# Patient Record
Sex: Female | Born: 1980 | Race: White | Hispanic: No | Smoking: Current every day smoker
Health system: Southern US, Community
[De-identification: ages and names within clinical notes are randomized; demographics above are authoritative.]

## PROBLEM LIST (undated history)

## (undated) DIAGNOSIS — E669 Obesity, unspecified: Secondary | ICD-10-CM

## (undated) DIAGNOSIS — K589 Irritable bowel syndrome without diarrhea: Secondary | ICD-10-CM

## (undated) DIAGNOSIS — I10 Essential (primary) hypertension: Secondary | ICD-10-CM

## (undated) HISTORY — PX: CHOLECYSTECTOMY: SHX55

## (undated) HISTORY — DX: Irritable bowel syndrome without diarrhea: K58.9

## (undated) HISTORY — DX: Obesity, unspecified: E66.9

## (undated) HISTORY — DX: Essential (primary) hypertension: I10

---

## 2013-06-10 ENCOUNTER — Emergency Department (HOSPITAL_COMMUNITY): Payer: Self-pay

## 2013-06-10 ENCOUNTER — Emergency Department (HOSPITAL_COMMUNITY)
Admission: EM | Admit: 2013-06-10 | Discharge: 2013-06-11 | Disposition: A | Discharge status: Home / Self Care | Payer: Self-pay | Attending: Emergency Medicine | Admitting: Emergency Medicine

## 2013-06-10 ENCOUNTER — Encounter (HOSPITAL_COMMUNITY): Payer: Self-pay | Admitting: Emergency Medicine

## 2013-06-10 DIAGNOSIS — F17200 Nicotine dependence, unspecified, uncomplicated: Secondary | ICD-10-CM | POA: Insufficient documentation

## 2013-06-10 DIAGNOSIS — Z87828 Personal history of other (healed) physical injury and trauma: Secondary | ICD-10-CM | POA: Insufficient documentation

## 2013-06-10 DIAGNOSIS — M8430XA Stress fracture, unspecified site, initial encounter for fracture: Secondary | ICD-10-CM

## 2013-06-10 DIAGNOSIS — M84376A Stress fracture, unspecified foot, initial encounter for fracture: Secondary | ICD-10-CM | POA: Insufficient documentation

## 2013-06-10 NOTE — ED Notes (Signed)
C/o L foot pain/brusing x 3 weeks.  States she slipped and fell 3 weeks ago.

## 2013-06-10 NOTE — ED Provider Notes (Signed)
CSN: [id number]     Arrival date & time 06/10/13  2317 History  This chart was scribed for non-physician practitioner Cleatrice Burke, PA-C working with Johnna Acosta, MD by Eston Mould, ED Scribe. This patient was seen in room TR05C/TR05C and the patient's care was started at 11:44 PM    Chief Complaint  Patient presents with  . Foot Pain   The history is provided by the patient. No language interpreter was used.   HPI Comments: ESMA KILTS is a 33 y.o. female who presents to the Emergency Department complaining of ongoing worsening L foot pain and bruising that began 3 weeks ago. Pt states she slipped and fell on water 3 weeks ago and states her L foot pain has been constant. The pain is dull and sharp when she ambulates. The pain is worse with ambulation. She was seen at [facility] and was told there was nothing broken. She has taken Tramadol which has not improved her pain. She denies fevers, chills, paresthesias, numbness, weakness, nausea, vomiting.    History reviewed. No pertinent past medical history. History reviewed. No pertinent past surgical history. No family history on file. History  Substance Use Topics  . Smoking status: Current Every Day Smoker  . Smokeless tobacco: Not on file  . Alcohol Use: No   OB History   Grav Para Term Preterm Abortions TAB SAB Ect Mult Living                 Review of Systems  Constitutional: Negative for fever and chills.  Respiratory: Negative for shortness of breath.   Cardiovascular: Negative for chest pain.  Musculoskeletal: Positive for arthralgias, gait problem and myalgias.  All other systems reviewed and are negative.    Allergies  Review of patient's allergies indicates not on file.  Home Medications  No current outpatient prescriptions on file.  BP 125/84  Pulse 93  Temp(Src) 97.5 F (36.4 C) (Oral)  Resp 16  Ht 5\' 5"  (1.651 m)  Wt 314 lb 4 oz (142.543 kg)  BMI 52.29 kg/m2  SpO2 99%  LMP  05/27/2013  Physical Exam  Nursing note and vitals reviewed. Constitutional: She is oriented to person, place, and time. She appears well-developed and well-nourished. She does not appear ill. No distress.  Morbidly obese  HENT:  Head: Normocephalic and atraumatic.  Right Ear: External ear normal.  Left Ear: External ear normal.  Nose: Nose normal.  Mouth/Throat: Oropharynx is clear and moist.  Eyes: Conjunctivae are normal.  Neck: Normal range of motion.  Cardiovascular: Normal rate, regular rhythm, normal heart sounds, intact distal pulses and normal pulses.   Pulses:      Dorsalis pedis pulses are 2+ on the right side, and 2+ on the left side.       Posterior tibial pulses are 2+ on the right side, and 2+ on the left side.  Cap refill < 3 seconds in all toes.   Pulmonary/Chest: Effort normal and breath sounds normal. No stridor. No respiratory distress. She has no wheezes. She has no rales.  Abdominal: Soft. She exhibits no distension.  Musculoskeletal: Normal range of motion.       Left ankle: She exhibits swelling and ecchymosis. She exhibits no deformity, no laceration and normal pulse. Tenderness. Achilles tendon normal.       Feet:  Compartment soft, neurovascularly intact.   Neurological: She is alert and oriented to person, place, and time. She has normal strength.  Skin: Skin is warm and dry.  She is not diaphoretic. No erythema.  Psychiatric: She has a normal mood and affect. Her behavior is normal.    ED Course  Procedures  DIAGNOSTIC STUDIES: Oxygen Saturation is 99% on RA, normal by my interpretation.    COORDINATION OF CARE: 11:42 PM-Discussed treatment plan which includes foot and ankle xrays with pt at bedside and pt agreed to plan.   Labs Review Labs Reviewed - No data to display Imaging Review Dg Ankle Complete Left  06/11/2013   CLINICAL DATA:  33 year old female with pain. Initial encounter.  EXAM: LEFT ANKLE COMPLETE - 3+ VIEW  COMPARISON:  None.   FINDINGS: Large body habitus. Mortise joint alignment preserved. No joint effusion. Talar dome intact. Early degenerative spurring at the medial malleolus and calcaneus. Calcaneus appears intact. Small accessory ossicles or degenerative ossification suspected at the cuboid. No acute fracture or dislocation identified.  IMPRESSION: Large body habitus with early degenerative changes. No acute fracture or dislocation identified about the left ankle.   Electronically Signed   By: Lars Pinks M.D.   On: 06/11/2013 00:13   Dg Foot Complete Left  06/11/2013   CLINICAL DATA:  33 year old female with pain. Fall 3 weeks ago. Initial encounter. Left ankle series from the same day reported separately.  EXAM: LEFT FOOT - COMPLETE 3+ VIEW  COMPARISON:  Left ankle series from the same day reported separately.  FINDINGS: Large body habitus. Calcaneus appears intact with early degenerative spurring. Small ossific fragments lateral to the cuboid. No donor site identified. There does appear to be a small area of periosteal new bone formation at the proximal third fifth metatarsal, with underlying heterogeneous bone mineralization of the cortex. No cortical break is identified. Chronic appearing fracture of the fifth proximal phalanx. No dislocation or other acute fracture identified.  IMPRESSION: 1. Suspect healing stress fracture proximal fifth metatarsal (arrows). 2. Small ossific fragments lateral to the cuboid could be accessory ossicles, degenerative, or posttraumatic (such as due to avulsion). However, favor these are chronic. 3. Chronic/healed fracture of the fifth proximal phalanx.   Electronically Signed   By: Lars Pinks M.D.   On: 06/11/2013 00:16    EKG Interpretation   None      MDM   1. Stress fracture    Patient presents 3 weeks after a fall with left foot pain. XR shows healing stress fracture in proximal fifth metatarsal, chronic/healed fracture of the fifth proximal phalanx, as well as changes in the cuboid  suspected to be chronic. I discussed these results with the patient. She was placed in a post op shoe and given orthopedic follow up. Patient declined crutches. She will be discharged home with norco. I also discussed with the patient the benefit of weight loss for these injuries. She expressed understanding and motivation for weight loss. Return instructions given. Vital signs stable for discharge. Patient / Family / Caregiver informed of clinical course, understand medical decision-making process, and agree with plan.   I personally performed the services described in this documentation, which was scribed in my presence. The recorded information has been reviewed and is accurate.    Elwyn Lade, PA-C 06/11/13 0130

## 2013-06-11 MED ORDER — HYDROCODONE-ACETAMINOPHEN 5-325 MG PO TABS: 1.0000 | ORAL_TABLET | Freq: Four times a day (QID) | ORAL | Status: DC | PRN

## 2013-06-11 MED ORDER — HYDROCODONE-ACETAMINOPHEN 5-325 MG PO TABS
2.0000 | ORAL_TABLET | Freq: Once | ORAL | Status: AC
  Administered 2013-06-11: 2 via ORAL
  Filled 2013-06-11: qty 2

## 2013-06-11 MED ORDER — ONDANSETRON 4 MG PO TBDP
8.0000 mg | ORAL_TABLET | Freq: Once | ORAL | Status: AC
  Administered 2013-06-11: 8 mg via ORAL
  Filled 2013-06-11: qty 2

## 2013-06-11 MED ORDER — PROMETHAZINE HCL 25 MG PO TABS: 25.0000 mg | ORAL_TABLET | Freq: Four times a day (QID) | ORAL | Status: DC | PRN

## 2013-06-11 NOTE — Discharge Instructions (Signed)
Stress Fracture A break in a bone that is caused by repetitive and/or intense exercise and prolonged pressure on the bone is known as a stress fracture. Stress fractures may pass through the entire bone (complete) or partially break (incomplete). SYMPTOMS   Inflammation at the fracture site.  Bleeding (uncommon).  Bruising (uncommon).  Pain and tenderness over the fracture site.  Weakness and inability to bear weight on the injured extremity.  Paleness and deformity (sometimes). CAUSES  Stress fractures are the result of repetitive micro trauma of a bone. The bone is injured at a greater rate than it can be healed by the body. This causes the bone to become weak and susceptible to fracture. Stress fractures typically follow changes in:  Training or performance schedule.  Equipment.  Intensity of activity. Women who no longer have their periods are more prone to stress fractures.  RISK INCREASES WITH:  Previous stress fracture.  Certain sports associated with specific fractures:  Leg: running, soccer, swimming, ballet, basketball.  Foot: running, walking, marching, swimming, soccer, ballet.  Heel bone: basketball, volleyball.  Thigh: running, basketball, jumping.  Kneecap (patella): basketball, catching in baseball.  Hand: tennis, handball.  Forearm: tennis, javelin.  Arm: baseball, cricket.  Ribs: tennis, baseball, golf, rowing.  Spine: gymnastics, football, cricket, water-skiing.  Bone abnormalities (osteoporosis or bone tumors).  Metabolic disorders or hormone problems.  Nutritional deficiencies and eating disorders.  Loss of or irregular menstrual periods.  Poor strength and flexibility.  Training on hard surfaces or worn out equipment (running with shoes with more than 600 miles of wear), hard arch supports made from metal or hard plastic (orthotics). PREVENTION  Warm up and stretch properly before activity.  Maintain physical fitness:  Muscle  strength and endurance.  Flexibility.  Cardiovascular fitness.  Learn and use proper technique with training and activity.  Gradually increase activity and training.  Wear properly fitted and padded protective equipment, including proper footwear; change shoes after 300 to 500 miles of running.  Women with menstrual period irregularity can take birth control pills to regulate periods and thus hormonal levels.  Runners with flat feet should wear cushioned arch supports. PROGNOSIS  Stress fractures are typically curable if treated properly. RELATED COMPLICATIONS   Failure to heal (nonunion).  Healing in a poor position (malunion).  Recurrence of stress fracture.  Stress fracture progressing to a complete and displaced fracture.  Risks of surgery, including infection, bleeding, injury to nerves (numbness, weakness, paralysis), need for further surgery, and bone death.  Recurrence of stress fractures, not necessarily in the same bone or location (occurs in 1 in 10 patients). TREATMENT  Treatment initially involves ice and medicine to help reduce pain and inflammation, as well as rest from any activity that causes symptoms to become more severe. Your caregiver may recommend that you immobilize the injured bone and associated joint(s) to allow for healing. Bone growth stimulators may be used to increase the rate of recovery. Occasionally surgery is necessary, especially if:  The fracture is at a high risk of moving and great risk of complications (hip).  The fracture is at a high risk of not healing (Jones fracture, certain leg fractures).  The fracture becomes complete and out of alignment (displaced). Immobility of a bone for a long period can cause loss of muscle bulk, stiffness in nearby joints, and accumulation of fluid in tissues (edema). Strengthening and stretch exercises may be necessary after immobilization to regain strength and a full range of motion. MEDICATION   If  pain medication is necessary, then nonsteroidal anti-inflammatory medications, such as aspirin and ibuprofen, or other minor pain relievers, such as acetaminophen, are often recommended.  Do not take pain medication for 7 days before surgery.  Prescription pain relievers may be prescribed. Use only as directed and only as much as you need.  Ointments applied to the skin may be helpful. SEEK MEDICAL CARE IF:   Symptoms get worse or do not improve in 2 weeks despite treatment.  Any of the following occur after immobilization or surgery (report any of these signs immediately):  Swelling above or below the fracture site.  Severe, persistent pain.  Blue or gray skin below the fracture site, especially under the nails, or numbness or loss of feeling below the fracture site.  New, unexplained symptoms develop (drugs used in treatment may produce side effects). Document Released: 04/21/2005 Document Revised: 07/14/2011 Document Reviewed: 08/03/2008 [facility] Patient Information 2014 [place], [place].

## 2013-06-11 NOTE — ED Provider Notes (Signed)
Medical screening examination/treatment/procedure(s) were performed by non-physician practitioner and as supervising physician I was immediately available for consultation/collaboration.    Johnna Acosta, MD 06/11/13 [phone]

## 2013-06-11 NOTE — ED Notes (Signed)
Ortho tech paged  

## 2013-06-11 NOTE — Progress Notes (Signed)
Orthopedic Tech Progress Note Patient Details:  Lindsay Soto [birth year] [id number]  Ortho Devices Type of Ortho Device: Postop shoe/boot   Katheren Shams 06/11/2013, 12:43 AM

## 2015-06-21 ENCOUNTER — Emergency Department
Admission: EM | Admit: 2015-06-21 | Discharge: 2015-06-21 | Discharge status: Absent without leave | Payer: Self-pay | Source: Home / Self Care

## 2015-08-27 ENCOUNTER — Ambulatory Visit (INDEPENDENT_AMBULATORY_CARE_PROVIDER_SITE_OTHER): Payer: Self-pay | Admitting: Family Medicine

## 2015-08-27 ENCOUNTER — Encounter: Payer: Self-pay | Admitting: Family Medicine

## 2015-08-27 VITALS — BP 163/102 | HR 85 | Ht 65.0 in | Wt 355.0 lb

## 2015-08-27 DIAGNOSIS — M793 Panniculitis, unspecified: Secondary | ICD-10-CM | POA: Insufficient documentation

## 2015-08-27 DIAGNOSIS — I10 Essential (primary) hypertension: Secondary | ICD-10-CM | POA: Insufficient documentation

## 2015-08-27 LAB — CBC
HCT: 37.2 % (ref 35.0–45.0)
Hemoglobin: 12.2 g/dL (ref 11.7–15.5)
MCH: 31.4 pg (ref 27.0–33.0)
MCHC: 32.8 g/dL (ref 32.0–36.0)
MCV: 95.9 fL (ref 80.0–100.0)
MPV: 10.1 fL (ref 7.5–12.5)
PLATELETS: 281 10*3/uL (ref 140–400)
RBC: 3.88 MIL/uL (ref 3.80–5.10)
RDW: 13.2 % (ref 11.0–15.0)
WBC: 7.4 10*3/uL (ref 3.8–10.8)

## 2015-08-27 MED ORDER — HYDROCHLOROTHIAZIDE 25 MG PO TABS: 25.0000 mg | ORAL_TABLET | Freq: Every day | ORAL | Status: DC

## 2015-08-27 MED ORDER — CEPHALEXIN 500 MG PO CAPS: 500.0000 mg | ORAL_CAPSULE | Freq: Four times a day (QID) | ORAL | Status: DC

## 2015-08-27 MED ORDER — NYSTATIN 100000 UNIT/GM EX CREA: 1.0000 "application " | TOPICAL_CREAM | Freq: Two times a day (BID) | CUTANEOUS | Status: DC

## 2015-08-27 NOTE — Progress Notes (Signed)
       Lindsay Soto is a 35 y.o. female who presents to [facility]: [facility] today for establish care and discuss redness on abdominal wall. Patient has morbid obesity and notes a previous history of elevated blood pressure. It's been many years since she has been to doctor. She also notes a history of IBS and laparoscopic cholecystectomy. She notes frequent bowel movements. She notes the abdominal wall redness started yesterday and is not painful. She feels well otherwise with no fevers or chills.   Past Medical History  Diagnosis Date  . Hypertension   . Obese    Past Surgical History  Procedure Laterality Date  . Cholecystectomy     Social History  Substance Use Topics  . Smoking status: Current Every Day Smoker  . Smokeless tobacco: Not on file  . Alcohol Use: No   family history includes Depression in her mother; Hyperlipidemia in her mother; Hypertension in her mother.  ROS as above Medications: Current Outpatient Prescriptions  Medication Sig Dispense Refill  . cephALEXin (KEFLEX) 500 MG capsule Take 1 capsule (500 mg total) by mouth 4 (four) times daily. 28 capsule 0  . hydrochlorothiazide (HYDRODIURIL) 25 MG tablet Take 1 tablet (25 mg total) by mouth daily. 30 tablet 0  . nystatin cream (MYCOSTATIN) Apply 1 application topically 2 (two) times daily. 60 g 1   No current facility-administered medications for this visit.   No Known Allergies   Exam:  BP 163/102 mmHg  Pulse 85  Ht 5\' 5"  (1.651 m)  Wt 355 lb (161.027 kg)  BMI 59.08 kg/m2 Gen: Well NAD Morbid obesity HEENT: EOMI,  MMM Lungs: Normal work of breathing. CTABL Heart: RRR no MRG Abd: NABS, Soft. Nondistended, Nontender Exts: Brisk capillary refill, warm and well perfused.  Skin: Abdominal wall redness with erythema no significant induration or ulcerations. Nontender. Blanchable. Left inferior corner is  involved  No results found for this or any previous visit (from the past 24 hour(s)). No results found.   Please see individual assessment and plan sections.

## 2015-08-27 NOTE — Assessment & Plan Note (Signed)
Check metabolic panel and start hydrochlorothiazide. Recheck in one month.

## 2015-08-27 NOTE — Assessment & Plan Note (Signed)
Panniculitis. Unclear etiology. Suspect fungal. Treat with nystatin ointment and Keflex.

## 2015-08-27 NOTE — Assessment & Plan Note (Signed)
Eliminate sugar sweetened beverages. Check CMP and A1c

## 2015-08-27 NOTE — Patient Instructions (Addendum)
Thank you for coming in today. Get labs,  Take antiotics and use fungal cream.  Start the blood pressure medicine.  Return in 1 month or sooner.   Please see Bonna Gains to qualify for reduced or free medical services within the [place].  Call her at [phone] today.  Please call or see Ms Cheryle Horsfall for assistance with your bill.  You may qualify for reduced or free services.  Her phone number is [phone]. Her email is [email or web address]   Hydrochlorothiazide, HCTZ capsules or tablets What is this medicine? HYDROCHLOROTHIAZIDE (hye droe klor oh THYE a zide) is a diuretic. It increases the amount of urine passed, which causes the body to lose salt and water. This medicine is used to treat high blood pressure. It is also reduces the swelling and water retention caused by various medical conditions, such as heart, liver, or kidney disease. This medicine may be used for other purposes; ask your health care provider or pharmacist if you have questions. What should I tell my health care provider before I take this medicine? They need to know if you have any of these conditions: -diabetes -gout -immune system problems, like lupus -kidney disease or kidney stones -liver disease -pancreatitis -small amount of urine or difficulty passing urine -an unusual or allergic reaction to hydrochlorothiazide, sulfa drugs, other medicines, foods, dyes, or preservatives -pregnant or trying to get pregnant -breast-feeding How should I use this medicine? Take this medicine by mouth with a glass of water. Follow the directions on the prescription label. Take your medicine at regular intervals. Remember that you will need to pass urine frequently after taking this medicine. Do not take your doses at a time of day that will cause you problems. Do not stop taking your medicine unless your doctor tells you to. Talk to your pediatrician regarding the use of this medicine in  children. Special care may be needed. Overdosage: If you think you have taken too much of this medicine contact a poison control center or emergency room at once. NOTE: This medicine is only for you. Do not share this medicine with others. What if I miss a dose? If you miss a dose, take it as soon as you can. If it is almost time for your next dose, take only that dose. Do not take double or extra doses. What may interact with this medicine? -cholestyramine -colestipol -digoxin -dofetilide -lithium -medicines for blood pressure -medicines for diabetes -medicines that relax muscles for surgery -other diuretics -steroid medicines like prednisone or cortisone This list may not describe all possible interactions. Give your health care provider a list of all the medicines, herbs, non-prescription drugs, or dietary supplements you use. Also tell them if you smoke, drink alcohol, or use illegal drugs. Some items may interact with your medicine. What should I watch for while using this medicine? Visit your doctor or health care professional for regular checks on your progress. Check your blood pressure as directed. Ask your doctor or health care professional what your blood pressure should be and when you should contact him or her. You may need to be on a special diet while taking this medicine. Ask your doctor. Check with your doctor or health care professional if you get an attack of severe diarrhea, nausea and vomiting, or if you sweat a lot. The loss of too much body fluid can make it dangerous for you to take this medicine. You may get drowsy or dizzy. Do not drive, use machinery, or do  anything that needs mental alertness until you know how this medicine affects you. Do not stand or sit up quickly, especially if you are an older patient. This reduces the risk of dizzy or fainting spells. Alcohol may interfere with the effect of this medicine. Avoid alcoholic drinks. This medicine may affect your  blood sugar level. If you have diabetes, check with your doctor or health care professional before changing the dose of your diabetic medicine. This medicine can make you more sensitive to the sun. Keep out of the sun. If you cannot avoid being in the sun, wear protective clothing and use sunscreen. Do not use sun lamps or tanning beds/booths. What side effects may I notice from receiving this medicine? Side effects that you should report to your doctor or health care professional as soon as possible: -allergic reactions such as skin rash or itching, hives, swelling of the lips, mouth, tongue, or throat -changes in vision -chest pain -eye pain -fast or irregular heartbeat -feeling faint or lightheaded, falls -gout attack -muscle pain or cramps -pain or difficulty when passing urine -pain, tingling, numbness in the hands or feet -redness, blistering, peeling or loosening of the skin, including inside the mouth -unusually weak or tired Side effects that usually do not require medical attention (report to your doctor or health care professional if they continue or are bothersome): -change in sex drive or performance -dry mouth -headache -stomach upset This list may not describe all possible side effects. Call your doctor for medical advice about side effects. You may report side effects to FDA at 1-800-FDA-1088. Where should I keep my medicine? Keep out of the reach of children. Store at room temperature between 15 and 30 degrees C (59 and 86 degrees F). Do not freeze. Protect from light and moisture. Keep container closed tightly. Throw away any unused medicine after the expiration date. NOTE: This sheet is a summary. It may not cover all possible information. If you have questions about this medicine, talk to your doctor, pharmacist, or health care provider.    2016, Elsevier/Gold Standard. (2009-12-14 12:57:37)

## 2015-08-28 ENCOUNTER — Encounter: Payer: Self-pay | Admitting: Family Medicine

## 2015-08-28 DIAGNOSIS — R7303 Prediabetes: Secondary | ICD-10-CM | POA: Insufficient documentation

## 2015-08-28 LAB — COMPREHENSIVE METABOLIC PANEL
ALT: 14 U/L (ref 6–29)
AST: 13 U/L (ref 10–30)
Albumin: 3.7 g/dL (ref 3.6–5.1)
Alkaline Phosphatase: 76 U/L (ref 33–115)
BUN: 10 mg/dL (ref 7–25)
CHLORIDE: 103 mmol/L (ref 98–110)
CO2: 27 mmol/L (ref 20–31)
CREATININE: 0.84 mg/dL (ref 0.50–1.10)
Calcium: 8.9 mg/dL (ref 8.6–10.2)
GLUCOSE: 92 mg/dL (ref 65–99)
POTASSIUM: 4 mmol/L (ref 3.5–5.3)
SODIUM: 138 mmol/L (ref 135–146)
Total Bilirubin: 0.2 mg/dL (ref 0.2–1.2)
Total Protein: 6.7 g/dL (ref 6.1–8.1)

## 2015-08-28 LAB — HEMOGLOBIN A1C
Hgb A1c MFr Bld: 5.9 % — ABNORMAL HIGH (ref <5.7)
Mean Plasma Glucose: 123 mg/dL

## 2015-08-28 NOTE — Progress Notes (Signed)
Quick Note:  Lindsay Soto has prediabetes. Work on reducing sugar in the diet especially in drinks. Try diet soda instead. Other labs looked OK. ______

## 2015-09-17 ENCOUNTER — Ambulatory Visit (INDEPENDENT_AMBULATORY_CARE_PROVIDER_SITE_OTHER): Payer: Self-pay | Admitting: Family Medicine

## 2015-09-17 VITALS — BP 146/94 | HR 89 | Wt 339.0 lb

## 2015-09-17 DIAGNOSIS — I10 Essential (primary) hypertension: Secondary | ICD-10-CM

## 2015-09-17 DIAGNOSIS — G4700 Insomnia, unspecified: Secondary | ICD-10-CM

## 2015-09-17 DIAGNOSIS — F329 Major depressive disorder, single episode, unspecified: Secondary | ICD-10-CM | POA: Insufficient documentation

## 2015-09-17 DIAGNOSIS — M793 Panniculitis, unspecified: Secondary | ICD-10-CM

## 2015-09-17 DIAGNOSIS — F332 Major depressive disorder, recurrent severe without psychotic features: Secondary | ICD-10-CM

## 2015-09-17 DIAGNOSIS — G4733 Obstructive sleep apnea (adult) (pediatric): Secondary | ICD-10-CM

## 2015-09-17 MED ORDER — LISINOPRIL-HYDROCHLOROTHIAZIDE 20-25 MG PO TABS: 1.0000 | ORAL_TABLET | Freq: Every day | ORAL | Status: DC

## 2015-09-17 MED ORDER — CITALOPRAM HYDROBROMIDE 10 MG PO TABS: 10.0000 mg | ORAL_TABLET | Freq: Every day | ORAL | Status: DC

## 2015-09-17 MED ORDER — TRAZODONE HCL 50 MG PO TABS: 25.0000 mg | ORAL_TABLET | Freq: Every evening | ORAL | Status: DC | PRN

## 2015-09-17 NOTE — Assessment & Plan Note (Signed)
Not well controlled. Increase to lisinopril/hydrochlorothiazide. Recheck in one month.

## 2015-09-17 NOTE — Assessment & Plan Note (Signed)
Improved will continue to follow.

## 2015-09-17 NOTE — Assessment & Plan Note (Signed)
Patient lost 20 pounds. Continue to encourage weight loss with diet modification.

## 2015-09-17 NOTE — Patient Instructions (Signed)
Thank you for coming in today. Take the new blood pressure medicine.  Take the celexa for depression and trazodone for sleep.  Return in 1 month.

## 2015-09-17 NOTE — Assessment & Plan Note (Signed)
Major depression severe. Start Celexa and trazodone. Recheck in one month.

## 2015-09-17 NOTE — Assessment & Plan Note (Signed)
Due to depression possibly also due to sleep apnea. Trial of trazodone. Recheck in one month.

## 2015-09-17 NOTE — Progress Notes (Signed)
       Lindsay Soto is a 35 y.o. female who presents to [facility]: [facility] today for   1) follow-up panniculitis: Patient was seen a month ago diagnosed with panniculitis and treated with Keflex and topical antifungal cream. This has helped a lot and she feels essentially 100% better.  2) follow-up hypertension: Patient was started on hydrochlorothiazide one month ago. She tolerates the medicine well with no chest pain palpitations or shortness of breath.  3) depression: Patient notes a history of major depression. Spent some time since she was ever on any medications for depression. She notes feeling down and depressed and hopeless and difficulty falling asleep. She feels better about herself. She denies any active SI or HI.  4) insomnia as noted above. Patient has had a long-term difficulty falling asleep and staying asleep. Has a diagnosis of sleep apnea and uses a CPAP machine.   5) disability. Patient about 10 years ago was declared permanently disabled. At some point somehow she lost this disability accreditation and is having to reapply. She notes that she has not worked in quite some time. She is unable to work due to her IBS-D, difficulty walking and standing, and depression and obesity.    Past Medical History  Diagnosis Date  . Hypertension   . Obese    Past Surgical History  Procedure Laterality Date  . Cholecystectomy     Social History  Substance Use Topics  . Smoking status: Current Every Day Smoker  . Smokeless tobacco: Not on file  . Alcohol Use: No   family history includes Depression in her mother; Hyperlipidemia in her mother; Hypertension in her mother.  ROS as above Medications: Current Outpatient Prescriptions  Medication Sig Dispense Refill  . citalopram (CELEXA) 10 MG tablet Take 1 tablet (10 mg total) by mouth daily. 30 tablet 0  . lisinopril-hydrochlorothiazide  (PRINZIDE,ZESTORETIC) 20-25 MG tablet Take 1 tablet by mouth daily. 30 tablet 0  . traZODone (DESYREL) 50 MG tablet Take 0.5-1 tablets (25-50 mg total) by mouth at bedtime as needed for sleep. 30 tablet 3   No current facility-administered medications for this visit.   No Known Allergies   Exam:  BP 146/94 mmHg  Pulse 89  Wt 339 lb (153.769 kg) Gen: Well NAD Morbidly obese HEENT: EOMI,  MMM Lungs: Normal work of breathing. CTABL Heart: RRR no MRG Abd: NABS, Soft. Nondistended, Nontender Exts: Brisk capillary refill, warm and well perfused.  Marland Kitchen Psych: Alert and oriented normal speech thought process and affect no SI or HI expressed.  PHQ9 is 20.  No results found for this or any previous visit (from the past 24 hour(s)). No results found.   Please see individual assessment and plan sections.

## 2015-09-26 ENCOUNTER — Ambulatory Visit (INDEPENDENT_AMBULATORY_CARE_PROVIDER_SITE_OTHER): Payer: Self-pay | Admitting: Family Medicine

## 2015-09-26 ENCOUNTER — Ambulatory Visit: Payer: Self-pay | Admitting: Family Medicine

## 2015-09-26 ENCOUNTER — Encounter: Payer: Self-pay | Admitting: Family Medicine

## 2015-09-26 VITALS — BP 139/90 | HR 83 | Wt 340.0 lb

## 2015-09-26 DIAGNOSIS — G4700 Insomnia, unspecified: Secondary | ICD-10-CM

## 2015-09-26 DIAGNOSIS — I10 Essential (primary) hypertension: Secondary | ICD-10-CM

## 2015-09-26 DIAGNOSIS — F332 Major depressive disorder, recurrent severe without psychotic features: Secondary | ICD-10-CM

## 2015-09-26 MED ORDER — CITALOPRAM HYDROBROMIDE 20 MG PO TABS: 20.0000 mg | ORAL_TABLET | Freq: Every day | ORAL | Status: DC

## 2015-09-26 MED ORDER — LISINOPRIL-HYDROCHLOROTHIAZIDE 20-25 MG PO TABS: 1.0000 | ORAL_TABLET | Freq: Every day | ORAL | Status: DC

## 2015-09-26 NOTE — Patient Instructions (Signed)
Thank you for coming in today. Increase citalopram to 20mg  daily.  Continue the other medicine.  Get myfitnesspal.  Return in 1 month.  Call or go to the emergency room if you get worse, have trouble breathing, have chest pains, or palpitations.

## 2015-09-26 NOTE — Progress Notes (Signed)
       Lindsay Soto is a 35 y.o. female who presents to [facility] today for   1) hypertension: Patient tolerates the medicine well. No chest pains palpitations or shortness of breath. She feels great.  2) depression and anxiety. Patient tolerates citalopram. She feels well and notes improvement in mood symptoms.  3) insomnia: Patient notes improvement in insomnia trazodone.   Past Medical History  Diagnosis Date  . Hypertension   . Obese    Past Surgical History  Procedure Laterality Date  . Cholecystectomy     Social History  Substance Use Topics  . Smoking status: Current Every Day Smoker  . Smokeless tobacco: Not on file  . Alcohol Use: No   family history includes Depression in her mother; Hyperlipidemia in her mother; Hypertension in her mother.  ROS as above:  Medications: Current Outpatient Prescriptions  Medication Sig Dispense Refill  . citalopram (CELEXA) 20 MG tablet Take 1 tablet (20 mg total) by mouth daily. 30 tablet 1  . lisinopril-hydrochlorothiazide (PRINZIDE,ZESTORETIC) 20-25 MG tablet Take 1 tablet by mouth daily. 30 tablet 0  . traZODone (DESYREL) 50 MG tablet Take 0.5-1 tablets (25-50 mg total) by mouth at bedtime as needed for sleep. 30 tablet 3   No current facility-administered medications for this visit.   No Known Allergies   Exam:  BP 139/90 mmHg  Pulse 83  Wt 340 lb (154.223 kg) Gen: Well NAD HEENT: EOMI,  MMM Lungs: Normal work of breathing. CTABL Heart: RRR no MRG Abd: NABS, Soft. Nondistended, Nontender Exts: Brisk capillary refill, warm and well perfused.   No results found for this or any previous visit (from the past 24 hour(s)). No results found.    Assessment and Plan: 35 y.o. female with   1) hypertension: Stable. Check metabolic panel. Recheck 1 month  2) depression and anxiety: Doing well. Increase  Celexa. Recheck in one month  3) insomnia: Stable continue trazodone  Discussed warning signs or symptoms. Please see discharge instructions. Patient expresses understanding.

## 2015-09-27 LAB — BASIC METABOLIC PANEL
BUN: 10 mg/dL (ref 7–25)
CO2: 25 mmol/L (ref 20–31)
Calcium: 9.4 mg/dL (ref 8.6–10.2)
Chloride: 96 mmol/L — ABNORMAL LOW (ref 98–110)
Creat: 0.66 mg/dL (ref 0.50–1.10)
GLUCOSE: 107 mg/dL — AB (ref 65–99)
POTASSIUM: 3.9 mmol/L (ref 3.5–5.3)
Sodium: 136 mmol/L (ref 135–146)

## 2015-09-27 NOTE — Progress Notes (Signed)
Quick Note:  Labs look great ______ 

## 2015-10-12 ENCOUNTER — Encounter: Payer: Self-pay | Admitting: Family Medicine

## 2015-10-12 ENCOUNTER — Ambulatory Visit (INDEPENDENT_AMBULATORY_CARE_PROVIDER_SITE_OTHER): Payer: Self-pay | Admitting: Family Medicine

## 2015-10-12 VITALS — BP 147/84 | HR 73 | Wt 338.0 lb

## 2015-10-12 DIAGNOSIS — N926 Irregular menstruation, unspecified: Secondary | ICD-10-CM

## 2015-10-12 DIAGNOSIS — I10 Essential (primary) hypertension: Secondary | ICD-10-CM

## 2015-10-12 MED ORDER — MEDROXYPROGESTERONE ACETATE 10 MG PO TABS: 10.0000 mg | ORAL_TABLET | Freq: Every day | ORAL | Status: DC

## 2015-10-12 MED ORDER — AMLODIPINE BESYLATE 10 MG PO TABS: 10.0000 mg | ORAL_TABLET | Freq: Every day | ORAL | Status: DC

## 2015-10-12 NOTE — Progress Notes (Signed)
       Lindsay Soto is a 35 y.o. female who presents to [facility] today for irregular bleeding. Patient has a one-month history of spotting and irregular bleeding. She's had some occasional irregular bleeding in the past. She's had a negative home pregnancy test. She feels well otherwise with no abdominal pain fevers chills nausea vomiting or diarrhea. She currently is trying to get Medicaid but does not have health insurance this time and is concerned about running up her bill.    Hypertension: Doing well with current medications. No chest pains palpitations shortness of breath.  Past Medical History  Diagnosis Date  . Hypertension   . Obese    Past Surgical History  Procedure Laterality Date  . Cholecystectomy     Social History  Substance Use Topics  . Smoking status: Current Every Day Smoker  . Smokeless tobacco: Not on file  . Alcohol Use: No   family history includes Depression in her mother; Hyperlipidemia in her mother; Hypertension in her mother.  ROS as above:  Medications: Current Outpatient Prescriptions  Medication Sig Dispense Refill  . amLODipine (NORVASC) 10 MG tablet Take 1 tablet (10 mg total) by mouth daily. 90 tablet 1  . citalopram (CELEXA) 20 MG tablet Take 1 tablet (20 mg total) by mouth daily. 30 tablet 1  . lisinopril-hydrochlorothiazide (PRINZIDE,ZESTORETIC) 20-25 MG tablet Take 1 tablet by mouth daily. 30 tablet 0  . medroxyPROGESTERone (PROVERA) 10 MG tablet Take 1 tablet (10 mg total) by mouth daily. 15 tablet 6  . traZODone (DESYREL) 50 MG tablet Take 0.5-1 tablets (25-50 mg total) by mouth at bedtime as needed for sleep. 30 tablet 3   No current facility-administered medications for this visit.   No Known Allergies   Exam:  BP 147/84 mmHg  Pulse 73  Wt 338 lb (153.316 kg) Gen: Well NAD Morbidly obese HEENT: EOMI,  MMM Lungs:  Normal work of breathing. CTABL Heart: RRR no MRG Abd: NABS, Soft. Nondistended, Nontender Exts: Brisk capillary refill, warm and well perfused.  GYN exam declined  No results found for this or any previous visit (from the past 24 hour(s)). No results found.    Assessment and Plan: 35 y.o. female with   1) irregular bleeding: Concern for PCOS or anovulatory cycles. Patient would like to avoid a more thorough workup. She's unable to provide urine for urine pregnancy test after one hour of waiting. Plan for return Monday for nurse visit for pregnancy test. Will treat with Provera intermittently. She obtains health insurance will complete a further workup with pelvic ultrasound with possible endometrial biopsy.  2) hypertension: Not well controlled. Continue lisinopril hydrochlorothiazide. Add amlodipine. Recheck in about a month.  Discussed warning signs or symptoms. Please see discharge instructions. Patient expresses understanding.

## 2015-10-12 NOTE — Patient Instructions (Addendum)
Thank you for coming in today. Take provera 5 days after your period. Take it for 15 days then stop. You should have a period and then return to normal. Your blood pressure is still up.  Start taking amlodipine daily for blood pressure control as well.  Return Monday for a nurse visit for a urine pregnancy test.  Do not take these medicines until you have a negative test.   Polycystic Ovarian Syndrome Polycystic ovarian syndrome (PCOS) is a common hormonal disorder among women of reproductive age. Most women with PCOS grow many small cysts on their ovaries. PCOS can cause problems with your periods and make it difficult to get pregnant. It can also cause an increased risk of miscarriage with pregnancy. If left untreated, PCOS can lead to serious health problems, such as diabetes and heart disease. CAUSES The cause of PCOS is not fully understood, but genetics may be a factor. SIGNS AND SYMPTOMS   Infrequent or no menstrual periods.   Inability to get pregnant (infertility) because of not ovulating.   Increased growth of hair on the face, chest, stomach, back, thumbs, thighs, or toes.   Acne, oily skin, or dandruff.   Pelvic pain.   Weight gain or obesity, usually carrying extra weight around the waist.   Type 2 diabetes.   High cholesterol.   High blood pressure.   Female-pattern baldness or thinning hair.   Patches of thickened and dark brown or black skin on the neck, arms, breasts, or thighs.   Tiny excess flaps of skin (skin tags) in the armpits or neck area.   Excessive snoring and having breathing stop at times while asleep (sleep apnea).   Deepening of the voice.   Gestational diabetes when pregnant.  DIAGNOSIS  There is no single test to diagnose PCOS.   Your health care provider will:   Take a medical history.   Perform a pelvic exam.   Have ultrasonography done.   Check your female and female hormone levels.   Measure glucose or  sugar levels in the blood.   Do other blood tests.   If you are producing too many female hormones, your health care provider will make sure it is from PCOS. At the physical exam, your health care provider will want to evaluate the areas of increased hair growth. Try to allow natural hair growth for a few days before the visit.   During a pelvic exam, the ovaries may be enlarged or swollen because of the increased number of small cysts. This can be seen more easily by using vaginal ultrasonography or screening to examine the ovaries and lining of the uterus (endometrium) for cysts. The uterine lining may become thicker if you have not been having a regular period.  TREATMENT  Because there is no cure for PCOS, it needs to be managed to prevent problems. Treatments are based on your symptoms. Treatment is also based on whether you want to have a baby or whether you need contraception.  Treatment may include:   Progesterone hormone to start a menstrual period.   Birth control pills to make you have regular menstrual periods.   Medicines to make you ovulate, if you want to get pregnant.   Medicines to control your insulin.   Medicine to control your blood pressure.   Medicine and diet to control your high cholesterol and triglycerides in your blood.  Medicine to reduce excessive hair growth.  Surgery, making small holes in the ovary, to decrease the amount of  female hormone production. This is done through a long, lighted tube (laparoscope) placed into the pelvis through a tiny incision in the lower abdomen.  HOME CARE INSTRUCTIONS  Only take over-the-counter or prescription medicine as directed by your health care provider.  Pay attention to the foods you eat and your activity levels. This can help reduce the effects of PCOS.  Keep your weight under control.  Eat foods that are low in carbohydrate and high in fiber.  Exercise regularly. SEEK MEDICAL CARE IF:  Your symptoms  do not get better with medicine.  You have new symptoms.   This information is not intended to replace advice given to you by your health care provider. Make sure you discuss any questions you have with your health care provider.   Document Released: 08/15/2004 Document Revised: 02/09/2013 Document Reviewed: 10/07/2012 Elsevier Interactive Patient Education [employer].

## 2015-10-15 ENCOUNTER — Ambulatory Visit: Payer: Self-pay

## 2015-10-18 ENCOUNTER — Ambulatory Visit: Payer: Self-pay | Admitting: Family Medicine

## 2015-10-24 ENCOUNTER — Ambulatory Visit: Payer: Self-pay | Admitting: Family Medicine

## 2015-10-29 ENCOUNTER — Ambulatory Visit (INDEPENDENT_AMBULATORY_CARE_PROVIDER_SITE_OTHER): Payer: Self-pay | Admitting: Family Medicine

## 2015-10-29 ENCOUNTER — Encounter: Payer: Self-pay | Admitting: Family Medicine

## 2015-10-29 VITALS — BP 124/76 | HR 76 | Wt 341.0 lb

## 2015-10-29 DIAGNOSIS — N926 Irregular menstruation, unspecified: Secondary | ICD-10-CM

## 2015-10-29 DIAGNOSIS — F332 Major depressive disorder, recurrent severe without psychotic features: Secondary | ICD-10-CM

## 2015-10-29 DIAGNOSIS — I10 Essential (primary) hypertension: Secondary | ICD-10-CM

## 2015-10-29 MED ORDER — LISINOPRIL-HYDROCHLOROTHIAZIDE 20-25 MG PO TABS: 1.0000 | ORAL_TABLET | Freq: Every day | ORAL | Status: DC

## 2015-10-29 NOTE — Patient Instructions (Signed)
Thank you for coming in today. Return in 2 months.

## 2015-10-29 NOTE — Progress Notes (Signed)
       Elisa C Tilden Dome is a 35 y.o. female who presents to [facility] today for hypertension, depression, obesity, irregular menstruation.  1) hypertension: She feels well no chest pains palpitations shortness of breath. She tolerates the medications well with no side effects.  2) depression: Feeling much better. No SI or HI expressed. She tolerates Celexa well.  3) irregular bleeding: Much better controlled with Provera  Past Medical History  Diagnosis Date  . Hypertension   . Obese    Past Surgical History  Procedure Laterality Date  . Cholecystectomy     Social History  Substance Use Topics  . Smoking status: Current Every Day Smoker  . Smokeless tobacco: Not on file  . Alcohol Use: No   family history includes Depression in her mother; Hyperlipidemia in her mother; Hypertension in her mother.  ROS as above:  Medications: Current Outpatient Prescriptions  Medication Sig Dispense Refill  . amLODipine (NORVASC) 10 MG tablet Take 1 tablet (10 mg total) by mouth daily. 90 tablet 1  . citalopram (CELEXA) 20 MG tablet Take 1 tablet (20 mg total) by mouth daily. 30 tablet 1  . lisinopril-hydrochlorothiazide (PRINZIDE,ZESTORETIC) 20-25 MG tablet Take 1 tablet by mouth daily. 90 tablet 1  . medroxyPROGESTERone (PROVERA) 10 MG tablet Take 1 tablet (10 mg total) by mouth daily. 15 tablet 6  . traZODone (DESYREL) 50 MG tablet Take 0.5-1 tablets (25-50 mg total) by mouth at bedtime as needed for sleep. 30 tablet 3   No current facility-administered medications for this visit.   No Known Allergies   Exam:  BP 124/76 mmHg  Pulse 76  Wt 341 lb (154.677 kg)  LMP 10/16/2015 (Exact Date) Gen: Well NAD Morbidly obese HEENT: EOMI,  MMM Lungs: Normal work of breathing. CTABL Heart: RRR no MRG Abd: NABS, Soft. Nondistended, Nontender Exts: Brisk capillary refill, warm and  well perfused.  Psych: Alert and oriented normal speech thought process. No SI or HI expressed. Normal affect.  No results found for this or any previous visit (from the past 24 hour(s)). No results found.    Assessment and Plan: 35 y.o. female with  1) hypertension: Doing well continue current treatment 2) depression: Doing well continue Celexa 3) irregular bleeding: Doing well continue Provera 4) weight: Persistent issue patient has difficulty maintaining weight loss. Hopefully she'll get insurance I will be in refer for bariatric surgery which she should do well with.  Discussed warning signs or symptoms. Please see discharge instructions. Patient expresses understanding.

## 2015-11-01 ENCOUNTER — Encounter: Payer: Self-pay | Admitting: Family Medicine

## 2015-11-01 NOTE — Progress Notes (Signed)
Comprehensive clinical psychological evaluation for DDS by Lysle Dingwall PhD performed on 10/23/2015 received and scanned into documentation.

## 2015-11-13 ENCOUNTER — Encounter: Payer: Self-pay | Admitting: Family Medicine

## 2015-11-19 ENCOUNTER — Ambulatory Visit (INDEPENDENT_AMBULATORY_CARE_PROVIDER_SITE_OTHER): Payer: Self-pay | Admitting: Family Medicine

## 2015-11-19 ENCOUNTER — Encounter: Payer: Self-pay | Admitting: Family Medicine

## 2015-11-19 VITALS — BP 136/80 | HR 74 | Wt 346.0 lb

## 2015-11-19 DIAGNOSIS — H9201 Otalgia, right ear: Secondary | ICD-10-CM

## 2015-11-19 NOTE — Progress Notes (Signed)
       Lindsay Soto is a 35 y.o. female who presents to [facility]: [facility] today for right-sided ear pain. Patient notes a one-month history of right ear pain and pressure and decreased hearing. She has not tried any medications yet. No fevers chills nausea vomiting or diarrhea.   Past Medical History  Diagnosis Date  . Hypertension   . Obese    Past Surgical History  Procedure Laterality Date  . Cholecystectomy     Social History  Substance Use Topics  . Smoking status: Current Every Day Smoker  . Smokeless tobacco: Not on file  . Alcohol Use: No   family history includes Depression in her mother; Hyperlipidemia in her mother; Hypertension in her mother.  ROS as above:  Medications: Current Outpatient Prescriptions  Medication Sig Dispense Refill  . amLODipine (NORVASC) 10 MG tablet Take 1 tablet (10 mg total) by mouth daily. 90 tablet 1  . citalopram (CELEXA) 20 MG tablet Take 1 tablet (20 mg total) by mouth daily. 30 tablet 1  . lisinopril-hydrochlorothiazide (PRINZIDE,ZESTORETIC) 20-25 MG tablet Take 1 tablet by mouth daily. 90 tablet 1  . medroxyPROGESTERone (PROVERA) 10 MG tablet Take 1 tablet (10 mg total) by mouth daily. 15 tablet 6  . traZODone (DESYREL) 50 MG tablet Take 0.5-1 tablets (25-50 mg total) by mouth at bedtime as needed for sleep. 30 tablet 3   No current facility-administered medications for this visit.   No Known Allergies   Exam:  BP 136/80 mmHg  Pulse 74  Wt 346 lb (156.945 kg)  LMP 10/16/2015 (Exact Date) Gen: Well NAD HEENT: EOMI,  MMM Right tympanic membrane is nonerythematous however there is a slight fluid behind the eardrums with a serous effusion. The right membrane is non-retracted. The left tympanic membrane is normal. Normal posterior pharynx. Clear nasal discharge.  Lungs: Normal work of breathing. CTABL Heart: RRR no MRG Abd:  NABS, Soft. Nondistended, Nontender Exts: Brisk capillary refill, warm and well perfused.   No results found for this or any previous visit (from the past 24 hour(s)). No results found.    Assessment and Plan: 35 y.o. female with serous otitis effusion. Treatment with Zyrtec. Follow-up as needed.  Discussed warning signs or symptoms. Please see discharge instructions. Patient expresses understanding.

## 2015-11-19 NOTE — Patient Instructions (Signed)
Thank you for coming in today. Take over the counter cetirizine also known as Zyrtec daily. You can get this cheaply over-the-counter at the [facility] outpatient pharmacy.

## 2015-12-04 ENCOUNTER — Ambulatory Visit: Payer: Self-pay | Admitting: Family Medicine

## 2016-01-28 ENCOUNTER — Encounter: Payer: Self-pay | Admitting: Family Medicine

## 2016-01-28 ENCOUNTER — Ambulatory Visit (INDEPENDENT_AMBULATORY_CARE_PROVIDER_SITE_OTHER): Payer: Self-pay | Admitting: Family Medicine

## 2016-01-28 VITALS — BP 149/79 | HR 79 | Temp 97.8°F | Wt 346.0 lb

## 2016-01-28 DIAGNOSIS — R05 Cough: Secondary | ICD-10-CM

## 2016-01-28 DIAGNOSIS — R059 Cough, unspecified: Secondary | ICD-10-CM

## 2016-01-28 MED ORDER — OMEPRAZOLE 40 MG PO CPDR: 40.0000 mg | DELAYED_RELEASE_CAPSULE | Freq: Every day | ORAL | 3 refills | Status: DC

## 2016-01-28 MED ORDER — PREDNISONE 10 MG PO TABS: 30.0000 mg | ORAL_TABLET | Freq: Every day | ORAL | 0 refills | Status: DC

## 2016-01-28 NOTE — Progress Notes (Signed)
       Lindsay Soto is a 35 y.o. female who presents to [facility]: [place] today for cough.  Patient endorses a 3 week history of a productive cough with thick, clear phlegm.  She claims the cough is worse at night.  She denies preceding URI.  No fever, chills, headache, sore throat, sneezing, or nasal congestion.  Patient also reports hoarseness and occasional wheezing when she coughs.  Denies shortness of breath and chest pain.  She notes several episodes of post-tussive emesis.  Denies heart burn and sour taste in her mouth after eating.  Patient has tried multiple OTC and herbal remedies without relief.  Of note, patient was started on lisinopril 2 months ago.  Additionally, she currently smokes 1/2 pack of cigarettes daily.     Past Medical History:  Diagnosis Date  . Hypertension   . Obese    Past Surgical History:  Procedure Laterality Date  . CHOLECYSTECTOMY     Social History  Substance Use Topics  . Smoking status: Current Every Day Smoker  . Smokeless tobacco: Not on file  . Alcohol use No   family history includes Depression in her mother; Hyperlipidemia in her mother; Hypertension in her mother.  ROS as above: No headache, visual changes, nausea, diarrhea, constipation, dizziness, skin rash, fevers, chills, night sweats, weight loss, swollen lymph nodes, body aches, joint swelling, muscle aches, chest pain, shortness of breath, mood changes, visual or auditory hallucinations.    Medications: Current Outpatient Prescriptions  Medication Sig Dispense Refill  . amLODipine (NORVASC) 10 MG tablet Take 1 tablet (10 mg total) by mouth daily. 90 tablet 1  . citalopram (CELEXA) 20 MG tablet Take 1 tablet (20 mg total) by mouth daily. 30 tablet 1  . lisinopril-hydrochlorothiazide (PRINZIDE,ZESTORETIC) 20-25 MG tablet Take 1 tablet by mouth daily. 90 tablet 1  .  medroxyPROGESTERone (PROVERA) 10 MG tablet Take 1 tablet (10 mg total) by mouth daily. 15 tablet 6  . traZODone (DESYREL) 50 MG tablet Take 0.5-1 tablets (25-50 mg total) by mouth at bedtime as needed for sleep. 30 tablet 3   No current facility-administered medications for this visit.    No Known Allergies   Exam:  BP (!) 149/79   Pulse 79   Temp 97.8 F (36.6 C) (Oral)   Wt (!) 346 lb (156.9 kg)   SpO2 99%   BMI 57.58 kg/m  Gen: Well NAD, obese HEENT: EOMI,  MMM.  Oropharynx non-erythematous.   Lungs: Normal work of breathing. CTABL.  No wheezing Heart: RRR no MRG Exts: Brisk capillary refill, warm and well perfused.   No results found for this or any previous visit (from the past 24 hour(s)). No results found.    Assessment and Plan: 35 y.o. female with 3 week history of productive cough and hoarseness, concerning for GERD vs viral bronchitis.  We will treat with Prilosec 40 mg daily.  If not improved, she can fill her prednisone dose pack prescription.   Also on the differential would be an ACE inhibitor induced cough, though this is unlikely in the setting of a productive cough.     No orders of the defined types were placed in this encounter.   Discussed warning signs or symptoms. Please see discharge instructions. Patient expresses understanding.

## 2016-01-28 NOTE — Patient Instructions (Signed)
Thank you for coming in today. Return as needed.  Take omeprazole for cough and if not better take prednisone.

## 2016-03-03 ENCOUNTER — Other Ambulatory Visit: Payer: Self-pay | Admitting: Family Medicine

## 2016-03-31 ENCOUNTER — Other Ambulatory Visit: Payer: Self-pay | Admitting: Family Medicine

## 2016-03-31 MED ORDER — LISINOPRIL-HYDROCHLOROTHIAZIDE 20-25 MG PO TABS: 1.0000 | ORAL_TABLET | Freq: Every day | ORAL | 1 refills | Status: DC

## 2016-03-31 MED ORDER — AMLODIPINE BESYLATE 10 MG PO TABS: 10.0000 mg | ORAL_TABLET | Freq: Every day | ORAL | 1 refills | Status: DC

## 2016-03-31 NOTE — Telephone Encounter (Signed)
Meds refilled. F/u next month

## 2016-04-15 ENCOUNTER — Ambulatory Visit (INDEPENDENT_AMBULATORY_CARE_PROVIDER_SITE_OTHER): Payer: Self-pay | Admitting: Family Medicine

## 2016-04-15 ENCOUNTER — Encounter: Payer: Self-pay | Admitting: Family Medicine

## 2016-04-15 VITALS — BP 155/88 | HR 79 | Wt 343.0 lb

## 2016-04-15 DIAGNOSIS — I10 Essential (primary) hypertension: Secondary | ICD-10-CM

## 2016-04-15 DIAGNOSIS — K0889 Other specified disorders of teeth and supporting structures: Secondary | ICD-10-CM | POA: Insufficient documentation

## 2016-04-15 DIAGNOSIS — F332 Major depressive disorder, recurrent severe without psychotic features: Secondary | ICD-10-CM

## 2016-04-15 MED ORDER — CITALOPRAM HYDROBROMIDE 40 MG PO TABS: 40.0000 mg | ORAL_TABLET | Freq: Every day | ORAL | 3 refills | Status: DC

## 2016-04-15 NOTE — Progress Notes (Signed)
Lindsay Soto is a 35 y.o. female who presents to [name]: [place] [facility] today for follow up mood, HTN and Obesity.   1) Mood: Patient has a history of depression. The patient has been previously controlled with citalopram 20 mg daily. She notes she is having worsening stress at home with her boyfriend Lindsay Soto. She stated about leaving but does not have any where she can go to. She notes a passive death wish but denies any active SI or HI. She does not have many resources she can seek out.  2) hypertension: Patient is a history of hypertension typically control with the below medicines. She notes she does not been taking her medicines for the last several days. No chest pain palpitations soreness of breath.  3) tooth pain: Patient notes history of impacted right upper wisdom tooth. This become painful in the last several weeks. She has not tried much treatment yet. No fevers or chills. Symptoms are moderate.  Past Medical History:  Diagnosis Date  . Hypertension   . Obese    Past Surgical History:  Procedure Laterality Date  . CHOLECYSTECTOMY     Social History  Substance Use Topics  . Smoking status: Current Every Day Smoker  . Smokeless tobacco: Not on file  . Alcohol use No   family history includes Depression in her mother; Hyperlipidemia in her mother; Hypertension in her mother.  ROS as above:  Medications: Current Outpatient Prescriptions  Medication Sig Dispense Refill  . amLODipine (NORVASC) 10 MG tablet Take 1 tablet (10 mg total) by mouth daily. 30 tablet 1  . citalopram (CELEXA) 40 MG tablet Take 1 tablet (40 mg total) by mouth daily. 30 tablet 3  . lisinopril-hydrochlorothiazide (PRINZIDE,ZESTORETIC) 20-25 MG tablet Take 1 tablet by mouth daily. 30 tablet 1  . medroxyPROGESTERone (PROVERA) 10 MG tablet Take 1 tablet (10 mg total) by mouth daily. 15 tablet 6  .  omeprazole (PRILOSEC) 40 MG capsule Take 1 capsule (40 mg total) by mouth daily. 30 capsule 3  . predniSONE (DELTASONE) 10 MG tablet Take 3 tablets (30 mg total) by mouth daily with breakfast. 15 tablet 0  . traZODone (DESYREL) 50 MG tablet Take 0.5-1 tablets (25-50 mg total) by mouth at bedtime as needed for sleep. 30 tablet 3   No current facility-administered medications for this visit.    No Known Allergies  Health Maintenance Health Maintenance  Topic Date Due  . HIV Screening  12/25/1993  . TETANUS/TDAP  12/25/1997  . PAP SMEAR  12/26/1999  . INFLUENZA VACCINE  12/04/2015     Exam:  BP (!) 155/88   Pulse 79   Wt (!) 343 lb (155.6 kg)   BMI 57.08 kg/m  Gen: Well NAD Morbidly obese HEENT: EOMI,  MMM slightly impacted right upper wisdom tooth nontender to touch no surrounding erythema. Lungs: Normal work of breathing. CTABL Heart: RRR no MRG Abd: NABS, Soft. Nondistended, Nontender Exts: Brisk capillary refill, warm and well perfused.  Psych: Alert and oriented normal process and affect.  Depression screen PHQ 2/9 04/15/2016  Decreased Interest 2  Down, Depressed, Hopeless 3  PHQ - 2 Score 5  Altered sleeping 3  Tired, decreased energy 3  Change in appetite 3  Feeling bad or failure about yourself  3  Trouble concentrating 0  Moving slowly or fidgety/restless 2  Suicidal thoughts 3  PHQ-9 Score 22   GAD 7 : Generalized Anxiety Score 04/15/2016  Nervous,  Anxious, on Edge 2  Control/stop worrying 3  Worry too much - different things 3  Trouble relaxing 3  Restless 2  Easily annoyed or irritable 3  Afraid - awful might happen 0  Total GAD 7 Score 16  Anxiety Difficulty Very difficult      No results found for this or any previous visit (from the past 58 hour(s)). No results found.    Assessment and Plan: 35 y.o. female with  Depression and anxiety: I will controlled. Increase citalopram to 40 mg. We'll fill out for a charity assistance program as this  will allow for counseling. Recheck in one month.  Hypertension not well controlled today due to medication nonadherence. Recommend patient take her medications will recheck in one month.  Tooth pain: Wisdom tooth. Does not appear infected today. There Is not much I can do at this time. Provided a list of dental resources.   No orders of the defined types were placed in this encounter.   Discussed warning signs or symptoms. Please see discharge instructions. Patient expresses understanding.

## 2016-04-15 NOTE — Patient Instructions (Addendum)
Thank you for coming in today. Please increase citalopram to 40mg  daily.  Recheck in 1 month.   Talk to [place] office in [place] to talk about public.    [place]: [employer] [facility] [place], [place]. [phone]   [facility] [phone] (ext [phone])  [place]  Please call Dr. Donn Pierini office [phone] or cell [phone] [address] [place] for tooth removal $200 includes exam, Xray, and extraction and follow up visit.  Bring list of current medications with you.   [place] [facility] [place] [facility]  [address]  [place], [place], [place], [address]  [phone], Ext. [phone]  2nd and 4th Thursday of the month at 6:30am (Simple extractions only - no wisdom teeth or surgery) First come/First serve -First 10 clients served  [facility] [place], [place] and [facility] residents only)  [address] [place]elaine Bhat [place], [place], [address]  [phone]  [place]  [phone]  [facility]  [phone]  Please call [facility] at [phone] to get the details to get your tooth pulled.

## 2016-04-29 ENCOUNTER — Ambulatory Visit (INDEPENDENT_AMBULATORY_CARE_PROVIDER_SITE_OTHER): Payer: Self-pay | Admitting: Physician Assistant

## 2016-04-29 DIAGNOSIS — N3281 Overactive bladder: Secondary | ICD-10-CM | POA: Insufficient documentation

## 2016-04-29 DIAGNOSIS — K591 Functional diarrhea: Secondary | ICD-10-CM

## 2016-04-29 DIAGNOSIS — J208 Acute bronchitis due to other specified organisms: Secondary | ICD-10-CM

## 2016-04-29 MED ORDER — CHOLESTYRAMINE 4 G PO PACK: 4.0000 g | PACK | Freq: Three times a day (TID) | ORAL | 2 refills | Status: DC

## 2016-04-29 MED ORDER — OXYBUTYNIN CHLORIDE 5 MG PO TABS: 5.0000 mg | ORAL_TABLET | Freq: Three times a day (TID) | ORAL | 2 refills | Status: DC

## 2016-04-29 MED ORDER — BENZONATATE 200 MG PO CAPS: 200.0000 mg | ORAL_CAPSULE | Freq: Two times a day (BID) | ORAL | 0 refills | Status: DC | PRN

## 2016-04-29 MED ORDER — HYDROCODONE-HOMATROPINE 5-1.5 MG/5ML PO SYRP: 5.0000 mL | ORAL_SOLUTION | Freq: Two times a day (BID) | ORAL | 0 refills | Status: DC | PRN

## 2016-04-29 NOTE — Progress Notes (Signed)
   Subjective:    Patient ID: Lindsay Soto, female    DOB: 14-Aug-1980, 35 y.o.   MRN: [id number]  HPI  Pt is a 35 yo morbidly obese female who presents to the clinic with cough for last month that does not seem to getting better. Saw Dr. Georgina Snell and start omeprazole for 2 weeks with no improvement then took prednisone with no improvement. Seemed to get better on its on and then 1-2 weeks later cough started back. No fever, chills, sinus pressure, ST, ear pain. Taking nyquil. Coughing so hard at times urinating on herself. It has worsened with cough been been present for years. She has to wear pads. She has trouble with urgency as well.   She also mentions loose stools multiple times a day since gallbladder was removed. She has been seen by GI but did not find anything. No medications have been tried. At times she feels like everything she eats goes right through her. She reports 3-5 loose stools a day. No melena, hematochezia, or mucus in stools. No abdominal pain.    Review of Systems  All other systems reviewed and are negative.      Objective:   Physical Exam  Constitutional: She is oriented to person, place, and time. She appears well-developed and well-nourished.  Morbidly obese.   HENT:  Head: Normocephalic and atraumatic.  Right Ear: External ear normal.  TM's clear bilaterally.  No tenderness over sinuses to palpation.  Oropharynx erythematous with PND.  Right nare with scab present externally and internally. Tender to touch.   Eyes: Conjunctivae are normal. Right eye exhibits no discharge. Left eye exhibits no discharge.  Neck: Normal range of motion. Neck supple.  Cardiovascular: Normal rate, regular rhythm and normal heart sounds.   Pulmonary/Chest: Effort normal and breath sounds normal. She has no wheezes.  No CVA tenderness.   Abdominal: Soft. Bowel sounds are normal. She exhibits no distension and no mass. There is no tenderness. There is no rebound and no guarding.   Lymphadenopathy:    She has no cervical adenopathy.  Neurological: She is alert and oriented to person, place, and time.  Psychiatric: She has a normal mood and affect. Her behavior is normal.          Assessment & Plan:  Marland KitchenMarland KitchenAmy was seen today for cough and urinary incontinence.  Diagnoses and all orders for this visit:  Acute bronchitis due to other specified organisms -     HYDROcodone-homatropine (HYCODAN) 5-1.5 MG/5ML syrup; Take 5 mLs by mouth every 12 (twelve) hours as needed. -     benzonatate (TESSALON) 200 MG capsule; Take 1 capsule (200 mg total) by mouth 2 (two) times daily as needed for cough.  OAB (overactive bladder) -     oxybutynin (DITROPAN) 5 MG tablet; Take 1 tablet (5 mg total) by mouth 3 (three) times daily.  Functional diarrhea -     cholestyramine (QUESTRAN) 4 g packet; Take 1 packet (4 g total) by mouth 3 (three) times daily with meals.   Discussed viral nature of bronchitis.  Symptomatic care discussed.   Cough likely making OAB worse. Will try oxybutynin immediate release since no insurance. Discussed kegals.   Per pt diarrhea ongoing since cholecystectomy in 1995 with no improvement. Would like to sty medication to 'thicken stools'. Will see if symptoms improve. Start bile acid sequesterant tid.  Follow up with PCP.

## 2016-04-29 NOTE — Patient Instructions (Signed)

## 2016-04-30 DIAGNOSIS — K589 Irritable bowel syndrome without diarrhea: Secondary | ICD-10-CM

## 2016-04-30 HISTORY — DX: Irritable bowel syndrome, unspecified: K58.9

## 2016-05-15 ENCOUNTER — Ambulatory Visit: Payer: Self-pay | Admitting: Family Medicine

## 2016-05-16 ENCOUNTER — Encounter: Payer: Self-pay | Admitting: Family Medicine

## 2016-05-16 ENCOUNTER — Ambulatory Visit (INDEPENDENT_AMBULATORY_CARE_PROVIDER_SITE_OTHER): Payer: Self-pay | Admitting: Family Medicine

## 2016-05-16 VITALS — BP 140/64 | HR 76 | Wt 345.0 lb

## 2016-05-16 DIAGNOSIS — I10 Essential (primary) hypertension: Secondary | ICD-10-CM

## 2016-05-16 DIAGNOSIS — N3281 Overactive bladder: Secondary | ICD-10-CM

## 2016-05-16 DIAGNOSIS — F332 Major depressive disorder, recurrent severe without psychotic features: Secondary | ICD-10-CM

## 2016-05-16 DIAGNOSIS — N926 Irregular menstruation, unspecified: Secondary | ICD-10-CM

## 2016-05-16 MED ORDER — HYDROCHLOROTHIAZIDE 25 MG PO TABS: 25.0000 mg | ORAL_TABLET | Freq: Every day | ORAL | 11 refills | Status: DC

## 2016-05-16 MED ORDER — TRAZODONE HCL 50 MG PO TABS: 25.0000 mg | ORAL_TABLET | Freq: Every evening | ORAL | 11 refills | Status: DC | PRN

## 2016-05-16 MED ORDER — OXYBUTYNIN CHLORIDE 5 MG PO TABS: 5.0000 mg | ORAL_TABLET | Freq: Four times a day (QID) | ORAL | 11 refills | Status: DC

## 2016-05-16 MED ORDER — MEDROXYPROGESTERONE ACETATE 10 MG PO TABS: 10.0000 mg | ORAL_TABLET | Freq: Every day | ORAL | 11 refills | Status: DC

## 2016-05-16 MED ORDER — LISINOPRIL 20 MG PO TABS: 20.0000 mg | ORAL_TABLET | Freq: Every day | ORAL | 11 refills | Status: DC

## 2016-05-16 MED ORDER — CITALOPRAM HYDROBROMIDE 40 MG PO TABS: 40.0000 mg | ORAL_TABLET | Freq: Every day | ORAL | 11 refills | Status: DC

## 2016-05-16 MED ORDER — AMLODIPINE BESYLATE 10 MG PO TABS: 10.0000 mg | ORAL_TABLET | Freq: Every day | ORAL | 11 refills | Status: DC

## 2016-05-16 NOTE — Progress Notes (Signed)
       Lindsay Soto is a 36 y.o. female who presents to [facility]: [place] today for follow-up hypertension depression vaginal bleeding and pain.  Hypertension: Patient takes the medications listed below. She denies any chest pain palpitations or shortness of breath.  Depression: Patient is reasonably well controlled with Celexa. She denies any SI or HI.  Vaginal bleeding. Patient is doing well with intermittent Provera. She denies further bleeding.  Chronic pain: Patient has wrist hand and elbow pain bilaterally. This is intermittent and chronic. She does not take any medications for it currently. She denies any recent injury or exacerbation.   Past Medical History:  Diagnosis Date  . Hypertension   . Obese    Past Surgical History:  Procedure Laterality Date  . CHOLECYSTECTOMY     Social History  Substance Use Topics  . Smoking status: Current Every Day Smoker  . Smokeless tobacco: Not on file  . Alcohol use No   family history includes Depression in her mother; Hyperlipidemia in her mother; Hypertension in her mother.  ROS as above:  Medications: Current Outpatient Prescriptions  Medication Sig Dispense Refill  . amLODipine (NORVASC) 10 MG tablet Take 1 tablet (10 mg total) by mouth daily. 30 tablet 11  . citalopram (CELEXA) 40 MG tablet Take 1 tablet (40 mg total) by mouth daily. 30 tablet 11  . hydrochlorothiazide (HYDRODIURIL) 25 MG tablet Take 1 tablet (25 mg total) by mouth daily. 30 tablet 11  . lisinopril (PRINIVIL,ZESTRIL) 20 MG tablet Take 1 tablet (20 mg total) by mouth daily. 30 tablet 11  . medroxyPROGESTERone (PROVERA) 10 MG tablet Take 1 tablet (10 mg total) by mouth daily. 15 tablet 11  . oxybutynin (DITROPAN) 5 MG tablet Take 1 tablet (5 mg total) by mouth 4 (four) times daily. 120 tablet 11  . traZODone (DESYREL) 50 MG tablet Take 0.5-1 tablets  (25-50 mg total) by mouth at bedtime as needed for sleep. 30 tablet 11   No current facility-administered medications for this visit.    No Known Allergies  Health Maintenance Health Maintenance  Topic Date Due  . HIV Screening  12/25/1993  . TETANUS/TDAP  12/25/1997  . PAP SMEAR  12/26/1999  . INFLUENZA VACCINE  12/04/2015     Exam:  BP 140/64   Pulse 76   Wt (!) 345 lb (156.5 kg)   BMI 57.41 kg/m  Gen: Well NAD Obese HEENT: EOMI,  MMM Lungs: Normal work of breathing. CTABL Heart: RRR no MRG Abd: NABS, Soft. Nondistended, Nontender Exts: Brisk capillary refill, warm and well perfused.   MSK: Nontender elbows and wrists and hands bilaterally. Normal wrist and hand motion bilaterally. Psych: Alert and oriented normal speech thought process and affect.  No results found for this or any previous visit (from the past 72 hour(s)). No results found.    Assessment and Plan: 36 y.o. female with  Hypertension: Almost at goal. Continue current regimen. Work on weight loss.  Depression: Doing well with Celexa. Continue current regimen.  Vaginal bleeding: Doing well continue Provera.  Pain: Likely MSK the overuse versus arthritis. Symptoms appear. Pretty mild. Recommend intermittent NSAIDs as needed. If patient likes I will be happy to be happy to prescribe ibuprofen 800 mg 3 times daily.   No orders of the defined types were placed in this encounter.   Discussed warning signs or symptoms. Please see discharge instructions. Patient expresses understanding.

## 2016-05-16 NOTE — Patient Instructions (Signed)
Thank you for coming in today. Continue current medicines.  Recheck in 3 months or so.  Return sooner if needed.

## 2016-08-14 ENCOUNTER — Ambulatory Visit (INDEPENDENT_AMBULATORY_CARE_PROVIDER_SITE_OTHER): Payer: Self-pay | Admitting: Family Medicine

## 2016-08-14 ENCOUNTER — Encounter: Payer: Self-pay | Admitting: Family Medicine

## 2016-08-14 VITALS — BP 138/83 | HR 85 | Wt 351.0 lb

## 2016-08-14 DIAGNOSIS — I10 Essential (primary) hypertension: Secondary | ICD-10-CM

## 2016-08-14 DIAGNOSIS — F332 Major depressive disorder, recurrent severe without psychotic features: Secondary | ICD-10-CM

## 2016-08-14 MED ORDER — VENLAFAXINE HCL ER 75 MG PO CP24: 75.0000 mg | ORAL_CAPSULE | Freq: Every day | ORAL | 1 refills | Status: DC

## 2016-08-14 NOTE — Progress Notes (Signed)
Lindsay Soto is a 36 y.o. female who presents to [facility] today for  Follow up HTN and discuss depression.   HTN: Lindsay Soto takes lisinopril, amlodipine, and hydrochlorothiazide daily for hypertension and feels well with no chest pain palpitations or shortness of breath.  Major depression: Patient has had depression now for sometime.  She previously has been reasonably well controlled with Celexa. However she has worsened recently. She has developed some suicidal passive death wish. She has no active plan. She feels as though she does not have much to live for. She notes that she does not have much support from her family.   Past Medical History:  Diagnosis Date  . Hypertension   . Obese    Past Surgical History:  Procedure Laterality Date  . CHOLECYSTECTOMY     Social History  Substance Use Topics  . Smoking status: Current Every Day Smoker  . Smokeless tobacco: Never Used  . Alcohol use No   family history includes Depression in her mother; Hyperlipidemia in her mother; Hypertension in her mother.  ROS as above:  Medications: Current Outpatient Prescriptions  Medication Sig Dispense Refill  . amLODipine (NORVASC) 10 MG tablet Take 1 tablet (10 mg total) by mouth daily. 30 tablet 11  . hydrochlorothiazide (HYDRODIURIL) 25 MG tablet Take 1 tablet (25 mg total) by mouth daily. 30 tablet 11  . lisinopril (PRINIVIL,ZESTRIL) 20 MG tablet Take 1 tablet (20 mg total) by mouth daily. 30 tablet 11  . medroxyPROGESTERone (PROVERA) 10 MG tablet Take 1 tablet (10 mg total) by mouth daily. 15 tablet 11  . oxybutynin (DITROPAN) 5 MG tablet Take 1 tablet (5 mg total) by mouth 4 (four) times daily. 120 tablet 11  . traZODone (DESYREL) 50 MG tablet Take 0.5-1 tablets (25-50 mg total) by mouth at bedtime as needed for sleep. 30 tablet 11  . venlafaxine XR (EFFEXOR XR) 75 MG 24  hr capsule Take 1 capsule (75 mg total) by mouth daily with breakfast. 30 capsule 1   No current facility-administered medications for this visit.    No Known Allergies  Health Maintenance Health Maintenance  Topic Date Due  . HIV Screening  12/25/1993  . TETANUS/TDAP  12/25/1997  . PAP SMEAR  12/26/1999  . INFLUENZA VACCINE  12/03/2016     Exam:  BP 138/83   Pulse 85   Wt (!) 351 lb (159.2 kg)   BMI 58.41 kg/m  Gen: Well NAD Morbidly obese HEENT: EOMI,  MMM Lungs: Normal work of breathing. CTABL Heart: RRR no MRG Abd: NABS, Soft. Nondistended, Nontender Exts: Brisk capillary refill, warm and well perfused.  Psych: Alert and oriented tearful affect normal thought process and speech. Passive death wish expressed no active suicidal ideation. No homicidal ideation. Patient contracts for safety today.   No results found for this or any previous visit (from the past 72 hour(s)). No results found.    Assessment and Plan: 36 y.o. female with  Retention doing well continue current regimen.  Major depression worsening today. Patient does not have any active suicidal ideation but does have a passive death wish. She has worsened. Plan to switch to Effexor as well as refer to psychiatry and psychology. Patient contracts for safety today. Additionally she was given the phone number for the national suicide hotline and the [facility] crisis line. Recheck in 1 week.   No orders of the defined types were placed  in this encounter.  Meds ordered this encounter  Medications  . venlafaxine XR (EFFEXOR XR) 75 MG 24 hr capsule    Sig: Take 1 capsule (75 mg total) by mouth daily with breakfast.    Dispense:  30 capsule    Refill:  1     Discussed warning signs or symptoms. Please see discharge instructions. Patient expresses understanding.

## 2016-08-14 NOTE — Patient Instructions (Addendum)
Thank you for coming in today.  Return in 1 week.   You should hear from [facility] soon.   Find Help 24/7 Call our 24-hour HelpLine at 938-368-1971 or ([phone] for immediate assistance for mental health and substance abuse issues. Or walk in to [facility] today.  START Effexor Friday.    Major Depressive Disorder, Adult Major depressive disorder (MDD) is a mental health condition. MDD often makes you feel sad, hopeless, or helpless. MDD can also cause symptoms in your body. MDD can affect your:  Work.  School.  Relationships.  Other normal activities. MDD can range from mild to very bad. It may occur once (single episode MDD). It can also occur many times (recurrent MDD). The main symptoms of MDD often include:  Feeling sad, depressed, or irritable most of the time.  Loss of interest. MDD symptoms also include:  Sleeping too much or too little.  Eating too much or too little.  A change in your weight.  Feeling tired (fatigue) or having low energy.  Feeling worthless.  Feeling guilty.  Trouble making decisions.  Trouble thinking clearly.  Thoughts of suicide or harming others.  Feeling weak.  Feeling agitated.  Keeping yourself from being around other people (isolation). Follow these instructions at home: Activity   Do these things as told by your doctor:  Go back to your normal activities.  Exercise regularly.  Spend time outdoors. Alcohol   Talk with your doctor about how alcohol can affect your antidepressant medicines.  Do not drink alcohol. Or, limit how much alcohol you drink.  This means no more than 1 drink a day for nonpregnant women and 2 drinks a day for men. One drink equals one of these:  12 oz of beer.  5 oz of wine.  1 oz of hard liquor. General instructions   Take over-the-counter and prescription medicines only as told by your doctor.  Eat a healthy  diet.  Get plenty of sleep.  Find activities that you enjoy. Make time to do them.  Think about joining a support group. Your doctor may be able to suggest a group for you.  Keep all follow-up visits as told by your doctor. This is important. Where to find more information:   [employer] on Mental Illness:  www.nami.org  U.S. National Institute of Mental Health:  https://carter.com/  National Suicide Prevention Lifeline:  (339) 156-3626. This is free, 24-hour help. Contact a doctor if:  Your symptoms get worse.  You have new symptoms. Get help right away if:  You self-harm.  You see, hear, taste, smell, or feel things that are not present (hallucinate). If you ever feel like you may hurt yourself or others, or have thoughts about taking your own life, get help right away. You can go to your nearest emergency department or call:  Your local emergency services (911 in the [place].S[place]  A suicide crisis helpline, such as the [place]:  7473745616. This is open 24 hours a day. This information is not intended to replace advice given to you by your health care provider. Make sure you discuss any questions you have with your health care provider. Document Released: 04/02/2015 Document Revised: 01/06/2016 Document Reviewed: 01/06/2016 Elsevier Interactive Patient Education  2017 [employer].

## 2016-08-21 ENCOUNTER — Ambulatory Visit (INDEPENDENT_AMBULATORY_CARE_PROVIDER_SITE_OTHER): Payer: Self-pay | Admitting: Family Medicine

## 2016-08-21 ENCOUNTER — Encounter: Payer: Self-pay | Admitting: Family Medicine

## 2016-08-21 VITALS — BP 152/79 | HR 73 | Wt 350.0 lb

## 2016-08-21 DIAGNOSIS — F332 Major depressive disorder, recurrent severe without psychotic features: Secondary | ICD-10-CM

## 2016-08-21 NOTE — Progress Notes (Signed)
Lindsay Soto is a 36 y.o. female who presents to [facility]: [place] today for follow-up of depression. Patient was seen last week for depression. She was found to have some suicidal ideation. She was referred to behavioral health prescribed Effexor and was asked checked today. She has not been able to get her Effexor due to medical costs. She continues to take citalopram. She never heard from behavioral health clinic. She notes continued suicidal ideation but denies any active plan.   Past Medical History:  Diagnosis Date  . Hypertension   . Obese    Past Surgical History:  Procedure Laterality Date  . CHOLECYSTECTOMY     Social History  Substance Use Topics  . Smoking status: Current Every Day Smoker  . Smokeless tobacco: Never Used  . Alcohol use No   family history includes Depression in her mother; Hyperlipidemia in her mother; Hypertension in her mother.  ROS as above:  Medications: Current Outpatient Prescriptions  Medication Sig Dispense Refill  . amLODipine (NORVASC) 10 MG tablet Take 1 tablet (10 mg total) by mouth daily. 30 tablet 11  . hydrochlorothiazide (HYDRODIURIL) 25 MG tablet Take 1 tablet (25 mg total) by mouth daily. 30 tablet 11  . lisinopril (PRINIVIL,ZESTRIL) 20 MG tablet Take 1 tablet (20 mg total) by mouth daily. 30 tablet 11  . medroxyPROGESTERone (PROVERA) 10 MG tablet Take 1 tablet (10 mg total) by mouth daily. 15 tablet 11  . oxybutynin (DITROPAN) 5 MG tablet Take 1 tablet (5 mg total) by mouth 4 (four) times daily. 120 tablet 11  . traZODone (DESYREL) 50 MG tablet Take 0.5-1 tablets (25-50 mg total) by mouth at bedtime as needed for sleep. 30 tablet 11  . venlafaxine XR (EFFEXOR XR) 75 MG 24 hr capsule Take 1 capsule (75 mg total) by mouth daily with breakfast. 30 capsule 1   No current facility-administered medications for this visit.     No Known Allergies  Health Maintenance Health Maintenance  Topic Date Due  . HIV Screening  12/25/1993  . TETANUS/TDAP  12/25/1997  . PAP SMEAR  12/26/1999  . INFLUENZA VACCINE  12/03/2016     Exam:  BP (!) 152/79   Pulse 73   Wt (!) 350 lb (158.8 kg)   BMI 58.24 kg/m  Gen: Well NAD HEENT: EOMI,  MMM Lungs: Normal work of breathing. CTABL Heart: RRR no MRG Abd: NABS, Soft. Nondistended, Nontender Exts: Brisk capillary refill, warm and well perfused.  Psych: Alert and oriented normal speech. Affect is tearful at times. Normal thought process. Depression screen [facility] 2/9 08/21/2016 08/14/2016 04/15/2016  Decreased Interest 3 3 2   Down, Depressed, Hopeless 3 3 3   PHQ - 2 Score 6 6 5   Altered sleeping 3 3 3   Tired, decreased energy 3 3 3   Change in appetite 3 3 3   Feeling bad or failure about yourself  3 3 3   Trouble concentrating 0 0 0  Moving slowly or fidgety/restless 2 2 2   Suicidal thoughts 3 3 3   PHQ-9 Score 23 23 22       No results found for this or any previous visit (from the past 72 hour(s)). No results found.    Assessment and Plan: 36 y.o. female with major depression with suicidal ideation. Patient cannot afford Effexor at this time but she said in the near future. We discussed options. Plan refer to behavioral health clinic and repeat provided numbers for the crisis line.  I recommend that she go ahead and call [facility] clinic and schedule a follow-up appointment herself ASAP. Recheck in a few weeks or sooner if needed. Contract for safety reviewed   No orders of the defined types were placed in this encounter.  No orders of the defined types were placed in this encounter.    Discussed warning signs or symptoms. Please see discharge instructions. Patient expresses understanding.

## 2016-08-21 NOTE — Patient Instructions (Addendum)
Thank you for coming in today. You should hear from psychiatry soon.  Let me know if you do not hear from psychiatry soon.   If you need help.  Call our 24-hour HelpLine at (217)569-6870 or 971-072-4029     Suicidal Feelings: How to Help Yourself Suicide is the taking of one's own life. If you feel as though life is getting too tough to handle and are thinking about suicide, get help right away. To get help:  Call your local emergency services (911 in the [place].S[place]  Call a suicide hotline to speak with a trained counselor who understands how you are feeling. The following is a list of suicide hotlines in the [place]. For a list of hotlines in [place], visit FindSkins.pl.  1-800-273-TALK 818-686-8092).  1-800-SUICIDE 432-012-5759).  708-051-0676. This is a hotline for Spanish speakers.  2-505-397-6BHA 7021723002). This is a hotline for TTY users.  1-866-4-U-TREVOR 785-755-0774). This is a hotline for lesbian, gay, bisexual, transgender, or questioning youth.  Contact a crisis center or a local suicide prevention center. To find a crisis center or suicide prevention center:  Call your local hospital, clinic, community service organization, mental health center, social service provider, or health department. Ask for assistance in connecting to a crisis center.  Visit BankingRep.com.au for a list of crisis centers in the [place], or visit www.suicideprevention.ca/thinking-about-suicide/find-a-crisis-centre for a list of centers in [place].  Visit the following websites:  National Suicide Prevention Lifeline: www.suicidepreventionlifeline.org  Hopeline: www.hopeline.[place] for Suicide Prevention: PromotionalLoans.co.za  The [employer] (for lesbian, gay, bisexual, transgender, or questioning youth): www.thetrevorproject.org How can I help myself feel  better?  Promise yourself that you will not do anything drastic when you have suicidal feelings. Remember, there is hope. Many people have gotten through suicidal thoughts and feelings, and you will, too. You may have gotten through them before, and this proves that you can get through them again.  Let family, friends, teachers, or counselors know how you are feeling. Try not to isolate yourself from those who care about you. Remember, they will want to help you. Talk with someone every day, even if you do not feel sociable. Face-to-face conversation is best.  Call a mental health professional and see one regularly.  Visit your primary health care provider every year.  Eat a well-balanced diet, and space your meals so you eat regularly.  Get plenty of rest.  Avoid alcohol and drugs, and remove them from your home. They will only make you feel worse.  If you are thinking of taking a lot of medicine, give your medicine to someone who can give it to you one day at a time. If you are on antidepressants and are concerned you will overdose, let your health care provider know so he or she can give you safer medicines. Ask your mental health professional about the possible side effects of any medicines you are taking.  Remove weapons, poisons, knives, and anything else that could harm you from your home.  Try to stick to routines. Follow a schedule every day. Put self-care on your schedule.  Make a list of realistic goals, and cross them off when you achieve them. Accomplishments give a sense of worth.  Wait until you are feeling better before doing the things you find difficult or unpleasant.  Exercise if you are able. You will feel better if you exercise for even a half hour each day.  Go out in the sun or into nature. This will help  you recover from depression faster. If you have a favorite place to walk, go there.  Do the things that have always given you pleasure. Play your favorite music,  read a good book, paint a picture, play your favorite instrument, or do anything else that takes your mind off your depression if it is safe to do.  Keep your living space well lit.  When you are feeling well, write yourself a letter about tips and support that you can read when you are not feeling well.  Remember that life's difficulties can be sorted out with help. Conditions can be treated. You can work on thoughts and strategies that serve you well. This information is not intended to replace advice given to you by your health care provider. Make sure you discuss any questions you have with your health care provider. Document Released: 10/26/2002 Document Revised: 12/19/2015 Document Reviewed: 08/16/2013 Elsevier Interactive Patient Education  2017 [employer].

## 2016-08-22 ENCOUNTER — Telehealth (HOSPITAL_COMMUNITY): Payer: Self-pay | Admitting: Psychiatry

## 2016-09-19 ENCOUNTER — Ambulatory Visit (INDEPENDENT_AMBULATORY_CARE_PROVIDER_SITE_OTHER): Payer: Self-pay | Admitting: Family Medicine

## 2016-09-19 ENCOUNTER — Encounter: Payer: Self-pay | Admitting: Family Medicine

## 2016-09-19 VITALS — BP 145/81 | Wt 350.0 lb

## 2016-09-19 DIAGNOSIS — F332 Major depressive disorder, recurrent severe without psychotic features: Secondary | ICD-10-CM

## 2016-09-19 NOTE — Progress Notes (Signed)
.                                                                     Lindsay Soto is a 36 y.o. female who presents to [place] [place] today for follow-up depression and anxiety. Patient was seen a month ago for depression and anxiety. At that time she was referred to [facility] in [place] due to passive suicidal thoughts and severe depression and anxiety. She apparently was attempted to be contacted by [facility] although they called her husband's phone and she never responded. She notes additionally she's been unable to get the Effexor that was called and still takes Celexa daily. She notes that she is about the same. She has considerable life stressors. She denies any active SI or HI.   Past Medical History:  Diagnosis Date  . Hypertension   . Obese    Past Surgical History:  Procedure Laterality Date  . CHOLECYSTECTOMY     Social History  Substance Use Topics  . Smoking status: Current Every Day Smoker  . Smokeless tobacco: Never Used  . Alcohol use No   family history includes Depression in her mother; Hyperlipidemia in her mother; Hypertension in her mother.  ROS as above:  Medications: Current Outpatient Prescriptions  Medication Sig Dispense Refill  . amLODipine (NORVASC) 10 MG tablet Take 1 tablet (10 mg total) by mouth daily. 30 tablet 11  . hydrochlorothiazide (HYDRODIURIL) 25 MG tablet Take 1 tablet (25 mg total) by mouth daily. 30 tablet 11  . lisinopril (PRINIVIL,ZESTRIL) 20 MG tablet Take 1 tablet (20 mg total) by mouth daily. 30 tablet 11  . medroxyPROGESTERone (PROVERA) 10 MG tablet Take 1 tablet (10 mg total) by mouth daily. 15 tablet 11  . oxybutynin (DITROPAN) 5 MG tablet Take 1 tablet (5 mg total) by mouth 4 (four) times daily. 120 tablet 11  . traZODone (DESYREL) 50 MG tablet Take 0.5-1 tablets (25-50 mg total) by mouth at bedtime as needed for sleep. 30 tablet 11  . venlafaxine XR (EFFEXOR XR)  75 MG 24 hr capsule Take 1 capsule (75 mg total) by mouth daily with breakfast. 30 capsule 1   No current facility-administered medications for this visit.    No Known Allergies  Health Maintenance Health Maintenance  Topic Date Due  . HIV Screening  12/25/1993  . TETANUS/TDAP  12/25/1997  . PAP SMEAR  12/26/1999  . INFLUENZA VACCINE  12/03/2016     Exam:  BP (!) 145/81   Wt (!) 350 lb (158.8 kg)   BMI 58.24 kg/m  Gen: Well NAD Psych: Alert and oriented tearful affect affect normal speech and thought process. No active SI or HI.  Depression screen [facility] 2/9 09/19/2016 08/21/2016 08/14/2016 04/15/2016  Decreased Interest 3 3 3 2   Down, Depressed, Hopeless 3 3 3 3   PHQ - 2 Score 6 6 6 5   Altered sleeping 3 3 3 3   Tired, decreased energy 3 3 3 3   Change in appetite 3 3 3 3   Feeling bad or failure about yourself  3 3 3 3   Trouble concentrating 0 0 0 0  Moving slowly or fidgety/restless 2 2 2 2   Suicidal thoughts 3 3 3  3  PHQ-9 Score 23 23 23 22    GAD 7 : Generalized Anxiety Score 09/19/2016 08/21/2016 04/15/2016  Nervous, Anxious, on Edge 3 3 2   Control/stop worrying 3 3 3   Worry too much - different things 2 2 3   Trouble relaxing 3 2 3   Restless 3 3 2   Easily annoyed or irritable 3 3 3   Afraid - awful might happen 3 3 0  Total GAD 7 Score 20 19 16   Anxiety Difficulty - Very difficult Very difficult       No results found for this or any previous visit (from the past 16 hour(s)). No results found.    Assessment and Plan: 36 y.o. female with Anxiety and depression severe. Plan to refer back to [facility]. Telephone number updated. Patient will call back in one week if she does not hear anything. Recommend trying to get Effexor and switching to Effexor from Celexa if possible. Verbal contract for safety. Suicidal plan reviewed. Recheck in one month.   Orders Placed This Encounter  Procedures  . Ambulatory referral to Behavioral Health    Referral Priority:    Routine    Referral Type:   Psychiatric    Referral Reason:   Specialty Services Required    Requested Specialty:   Behavioral Health    Number of Visits Requested:   1   No orders of the defined types were placed in this encounter.    Discussed warning signs or symptoms. Please see discharge instructions. Patient expresses understanding.

## 2016-09-19 NOTE — Patient Instructions (Addendum)
Thank you for coming in today. Return for recheck in 1 month or so.  Please try to switch to effexor if able.  Please let me know if you feel worse.   You need help.  Call our 24-hour HelpLine at [phone] or [phone]     Suicidal Feelings: How to Help Yourself Suicide is the taking of one's own life. If you feel as though life is getting too tough to handle and are thinking about suicide, get help right away. To get help:  Call your local emergency services (911 in the [place]  Call a suicide hotline to speak with a trained counselor who understands how you are feeling. The following is a list of suicide hotlines in the [place]. For a list of hotlines in [place], visit FindSkins.pl. ? 1-800-273-TALK 3172850729). ? 1-800-SUICIDE 518-513-3963). ? 947-474-0406. This is a hotline for Spanish speakers. ? 1-800-799-4TTY 979-655-3754). This is a hotline for TTY users. ? 1-866-4-U-TREVOR 443-888-6675). This is a hotline for lesbian, gay, bisexual, transgender, or questioning youth.  Contact a crisis center or a local suicide prevention center. To find a crisis center or suicide prevention center: ? Call your local hospital, clinic, community service organization, mental health center, social service provider, or health department. Ask for assistance in connecting to a crisis center. ? Visit BankingRep.com.au for a list of crisis centers in the [place], or visit www.suicideprevention.ca/thinking-about-suicide/find-a-crisis-centre for a list of centers in [place].  Visit the following websites: ? National Suicide Prevention Lifeline: www.suicidepreventionlifeline.org ? Hopeline: www.hopeline.com ? [employer] for Suicide Prevention: PromotionalLoans.co.za ? [employer] (for lesbian, gay, bisexual, transgender, or questioning youth): www.thetrevorproject.org How  can I help myself feel better?  Promise yourself that you will not do anything drastic when you have suicidal feelings. Remember, there is hope. Many people have gotten through suicidal thoughts and feelings, and you will, too. You may have gotten through them before, and this proves that you can get through them again.  Let family, friends, teachers, or counselors know how you are feeling. Try not to isolate yourself from those who care about you. Remember, they will want to help you. Talk with someone every day, even if you do not feel sociable. Face-to-face conversation is best.  Call a mental health professional and see one regularly.  Visit your primary health care provider every year.  Eat a well-balanced diet, and space your meals so you eat regularly.  Get plenty of rest.  Avoid alcohol and drugs, and remove them from your home. They will only make you feel worse.  If you are thinking of taking a lot of medicine, give your medicine to someone who can give it to you one day at a time. If you are on antidepressants and are concerned you will overdose, let your health care provider know so he or she can give you safer medicines. Ask your mental health professional about the possible side effects of any medicines you are taking.  Remove weapons, poisons, knives, and anything else that could harm you from your home.  Try to stick to routines. Follow a schedule every day. Put self-care on your schedule.  Make a list of realistic goals, and cross them off when you achieve them. Accomplishments give a sense of worth.  Wait until you are feeling better before doing the things you find difficult or unpleasant.  Exercise if you are able. You will feel better if you exercise for even a half hour each day.  Go out in the  sun or into nature. This will help you recover from depression faster. If you have a favorite place to walk, go there.  Do the things that have always given you pleasure.  Play your favorite music, read a good book, paint a picture, play your favorite instrument, or do anything else that takes your mind off your depression if it is safe to do.  Keep your living space well lit.  When you are feeling well, write yourself a letter about tips and support that you can read when you are not feeling well.  Remember that life's difficulties can be sorted out with help. Conditions can be treated. You can work on thoughts and strategies that serve you well. This information is not intended to replace advice given to you by your health care provider. Make sure you discuss any questions you have with your health care provider. Document Released: 10/26/2002 Document Revised: 12/19/2015 Document Reviewed: 08/16/2013 Elsevier Interactive Patient Education  2017 [employer].

## 2016-09-25 ENCOUNTER — Telehealth: Payer: Self-pay | Admitting: Family Medicine

## 2016-09-25 NOTE — Telephone Encounter (Signed)
Called pt. Provided her with phone number to schedule appt with [facility]. Pt verbalized understanding.

## 2016-09-25 NOTE — Telephone Encounter (Signed)
Pt called back and stated that she cannot ben seen at a [facility] facility due to costs. She is going to attempt to get in with [facility] as they advised that she may qualify for a financial assistance program. Pt advised that's she will call with an update.

## 2016-09-25 NOTE — Telephone Encounter (Signed)
Please check that Lindsay Soto has  Contact behavorial health in [place] and made an appointment.

## 2016-10-20 ENCOUNTER — Encounter: Payer: Self-pay | Admitting: Family Medicine

## 2016-10-20 ENCOUNTER — Ambulatory Visit (INDEPENDENT_AMBULATORY_CARE_PROVIDER_SITE_OTHER): Payer: Self-pay | Admitting: Family Medicine

## 2016-10-20 VITALS — BP 145/68 | HR 81 | Wt 355.0 lb

## 2016-10-20 DIAGNOSIS — F332 Major depressive disorder, recurrent severe without psychotic features: Secondary | ICD-10-CM

## 2016-10-20 MED ORDER — CITALOPRAM HYDROBROMIDE 40 MG PO TABS: 40.0000 mg | ORAL_TABLET | Freq: Every day | ORAL | 3 refills | Status: DC

## 2016-10-20 NOTE — Progress Notes (Signed)
Lindsay Soto is a 36 y.o. female who presents to [place] [place] today for follow-up depression. Patient continues to experience depression and anxiety symptoms. She has not been able to afford new medication nor attend any further help. She has been unable to complete the forms for [facility] patient assistance. She is moving out of her current living environment. She will no longer be living with her long-term boyfriend Lindsay Soto.    Past Medical History:  Diagnosis Date  . Hypertension   . Obese    Past Surgical History:  Procedure Laterality Date  . CHOLECYSTECTOMY     Social History  Substance Use Topics  . Smoking status: Current Every Day Smoker  . Smokeless tobacco: Never Used  . Alcohol use No   family history includes Depression in her mother; Hyperlipidemia in her mother; Hypertension in her mother.  ROS as above:  Medications: Current Outpatient Prescriptions  Medication Sig Dispense Refill  . amLODipine (NORVASC) 10 MG tablet Take 1 tablet (10 mg total) by mouth daily. 30 tablet 11  . hydrochlorothiazide (HYDRODIURIL) 25 MG tablet Take 1 tablet (25 mg total) by mouth daily. 30 tablet 11  . lisinopril (PRINIVIL,ZESTRIL) 20 MG tablet Take 1 tablet (20 mg total) by mouth daily. 30 tablet 11  . medroxyPROGESTERone (PROVERA) 10 MG tablet Take 1 tablet (10 mg total) by mouth daily. 15 tablet 11  . oxybutynin (DITROPAN) 5 MG tablet Take 1 tablet (5 mg total) by mouth 4 (four) times daily. 120 tablet 11  . traZODone (DESYREL) 50 MG tablet Take 0.5-1 tablets (25-50 mg total) by mouth at bedtime as needed for sleep. 30 tablet 11  . citalopram (CELEXA) 40 MG tablet Take 1 tablet (40 mg total) by mouth daily. 30 tablet 3   No current facility-administered medications for this visit.    No Known Allergies  Health Maintenance Health Maintenance  Topic Date Due  .  HIV Screening  12/25/1993  . TETANUS/TDAP  12/25/1997  . PAP SMEAR  12/26/1999  . INFLUENZA VACCINE  12/03/2016     Exam:  BP (!) 145/68   Pulse 81   Wt (!) 355 lb (161 kg)   BMI 59.08 kg/m  Gen: Well NAD Obese HEENT: EOMI,  MMM Lungs: Normal work of breathing. CTABL Heart: RRR no MRG Abd: NABS, Soft. Nondistended, Nontender Exts: Brisk capillary refill, warm and well perfused.  Psych: Alert and oriented normal speech suppressant affect no active SI or HI. Depression screen [facility] 2/9 10/20/2016 09/19/2016 08/21/2016 08/14/2016 04/15/2016  Decreased Interest 2 3 3 3 2   Down, Depressed, Hopeless 3 3 3 3 3   PHQ - 2 Score 5 6 6 6 5   Altered sleeping 3 3 3 3 3   Tired, decreased energy 3 3 3 3 3   Change in appetite 3 3 3 3 3   Feeling bad or failure about yourself  3 3 3 3 3   Trouble concentrating 2 0 0 0 0  Moving slowly or fidgety/restless 2 2 2 2 2   Suicidal thoughts 3 3 3 3 3   PHQ-9 Score 24 23 23 23 22    GAD 7 : Generalized Anxiety Score 10/20/2016 09/19/2016 08/21/2016 04/15/2016  Nervous, Anxious, on Edge 3 3 3 2   Control/stop worrying 3 3 3 3   Worry too much - different things 2 2 2 3   Trouble relaxing 3 3 2 3   Restless 2 3 3 2   Easily annoyed or irritable 3  3 3 3   Afraid - awful might happen 3 3 3  0  Total GAD 7 Score 19 20 19 16   Anxiety Difficulty - - Very difficult Very difficult       No results found for this or any previous visit (from the past 62 hour(s)). No results found.    Assessment and Plan: 36 y.o. female with table depression difficult to control. Fundamentally barriers or lack of health insurance. I went over in detail with patient how to in person apply for [facility] patient assistance which I think is a prerequisite to getting her into [facility] [facility] clinic which will be helpful. Return in 3 months or sooner if needed.   No orders of the defined types were placed in this encounter.  Meds ordered this encounter  Medications  .  citalopram (CELEXA) 40 MG tablet    Sig: Take 1 tablet (40 mg total) by mouth daily.    Dispense:  30 tablet    Refill:  3     Discussed warning signs or symptoms. Please see discharge instructions. Patient expresses understanding.

## 2016-10-20 NOTE — Patient Instructions (Signed)
Thank you for coming in today. Continue medications.  I recommend that you go to [facility] and go to the check in desk and ask to be taken to Patient Assistance Office.  You will need some help filling out the [facility] Patient Assistance form.  Bring in bank statements with you.    Recheck in 2-3 months.   Once you get patient assistance we can get you into behavioral health more easily.

## 2016-11-26 ENCOUNTER — Ambulatory Visit (INDEPENDENT_AMBULATORY_CARE_PROVIDER_SITE_OTHER): Payer: Self-pay | Admitting: Family Medicine

## 2016-11-26 VITALS — BP 132/74 | HR 84 | Wt 355.0 lb

## 2016-11-26 DIAGNOSIS — G8929 Other chronic pain: Secondary | ICD-10-CM

## 2016-11-26 DIAGNOSIS — G4733 Obstructive sleep apnea (adult) (pediatric): Secondary | ICD-10-CM

## 2016-11-26 DIAGNOSIS — I10 Essential (primary) hypertension: Secondary | ICD-10-CM

## 2016-11-26 DIAGNOSIS — R51 Headache: Secondary | ICD-10-CM

## 2016-11-26 DIAGNOSIS — N926 Irregular menstruation, unspecified: Secondary | ICD-10-CM

## 2016-11-26 MED ORDER — NORGESTIMATE-ETH ESTRADIOL 0.25-35 MG-MCG PO TABS: 1.0000 | ORAL_TABLET | Freq: Every day | ORAL | 11 refills | Status: DC

## 2016-11-26 MED ORDER — RANITIDINE HCL 300 MG PO TABS: 300.0000 mg | ORAL_TABLET | Freq: Two times a day (BID) | ORAL | 3 refills | Status: DC

## 2016-11-26 NOTE — Patient Instructions (Signed)
Thank you for coming in today. Go back to [facility] Patient assistance to get on the program.   STOP Provera  START Sprintec daily.  Take ranitidine twice daily for acid reflux.  Continue blood pressure medicines.  Recheck in 1 month.  Bring the CPAP machine, masks, tubes and power cord with you.  We will try to program it.    Tinnitus Tinnitus refers to hearing a sound when there is no actual source for that sound. This is often described as ringing in the ears. However, people with this condition may hear a variety of noises. A person may hear the sound in one ear or in both ears. The sounds of tinnitus can be soft, loud, or somewhere in between. Tinnitus can last for a few seconds or can be constant for days. It may go away without treatment and come back at various times. When tinnitus is constant or happens often, it can lead to other problems, such as trouble sleeping and trouble concentrating. Almost everyone experiences tinnitus at some point. Tinnitus that is long-lasting (chronic) or comes back often is a problem that may require medical attention. What are the causes? The cause of tinnitus is often not known. In some cases, it can result from other problems or conditions, including:  Exposure to loud noises from machinery, music, or other sources.  Hearing loss.  Ear or sinus infections.  Earwax buildup.  A foreign object in the ear.  Use of certain medicines.  Use of alcohol and caffeine.  High blood pressure.  Heart diseases.  Anemia.  Allergies.  Meniere disease.  Thyroid problems.  Tumors.  An enlarged part of a weakened blood vessel (aneurysm).  What are the signs or symptoms? The main symptom of tinnitus is hearing a sound when there is no source for that sound. It may sound like:  Buzzing.  Roaring.  Ringing.  Blowing air, similar to the sound heard when you listen to a seashell.  Hissing.  Whistling.  Sizzling.  Humming.  Running  water.  A sustained musical note.  How is this diagnosed? Tinnitus is diagnosed based on your symptoms. Your health care provider will do a physical exam. A comprehensive hearing exam (audiologic exam) will be done if your tinnitus:  Affects only one ear (unilateral).  Causes hearing difficulties.  Lasts 6 months or longer.  You may also need to see a health care provider who specializes in hearing disorders (audiologist). You may be asked to complete a questionnaire to determine the severity of your tinnitus. Tests may be done to help determine the cause and to rule out other conditions. These can include:  Imaging studies of your head and brain, such as: ? A CT scan. ? An MRI.  An imaging study of your blood vessels (angiogram).  How is this treated? Treating an underlying medical condition can sometimes make tinnitus go away. If your tinnitus continues, other treatments may include:  Medicines, such as certain antidepressants or sleeping aids.  Sound generators to mask the tinnitus. These include: ? Tabletop sound machines that play relaxing sounds to help you fall asleep. ? Wearable devices that fit in your ear and play sounds or music. ? A small device that uses headphones to deliver a signal embedded in music (acoustic neural stimulation). In time, this may change the pathways of your brain and make you less sensitive to tinnitus. This device is used for very severe cases when no other treatment is working.  Therapy and counseling to  help you manage the stress of living with tinnitus.  Using hearing aids or cochlear implants, if your tinnitus is related to hearing loss.  Follow these instructions at home:  When possible, avoid being in loud places and being exposed to loud sounds.  Wear hearing protection, such as earplugs, when you are exposed to loud noises.  Do not take stimulants, such as nicotine, alcohol, or caffeine.  Practice techniques for reducing stress,  such as meditation, yoga, or deep breathing.  Use a white noise machine, a humidifier, or other devices to mask the sound of tinnitus.  Sleep with your head slightly raised. This may reduce the impact of tinnitus.  Try to get plenty of rest each night. Contact a health care provider if:  You have tinnitus in just one ear.  Your tinnitus continues for 3 weeks or longer without stopping.  Home care measures are not helping.  You have tinnitus after a head injury.  You have tinnitus along with any of the following: ? Dizziness. ? Loss of balance. ? Nausea and vomiting. This information is not intended to replace advice given to you by your health care provider. Make sure you discuss any questions you have with your health care provider. Document Released: 04/21/2005 Document Revised: 12/23/2015 Document Reviewed: 09/21/2013 Elsevier Interactive Patient Education  2018 [employer].

## 2016-11-26 NOTE — Progress Notes (Signed)
Lindsay Soto is a 36 y.o. female who presents to [place] [place] today for headache, irregular bleeding, and HTN.   Irregular bleeding: Patient has a long history of irregular bleeding. She was prescribed medroxyprogesterone to take for 10 days during her cycle. She's been taking it daily and notes constant spotting with occasional heavy periods. She denies abdominal pain fevers or chills. She does not have health insurance and finds it difficult to see specialists.  Acid reflux: Patient has daily bothersome as her reflux. She does not take any medications for this.  Headache: Patient has chronic daily frontal headache. She is a history of sleep apnea but does not use her CPAP machine as he finds it uncomfortable. She denies any loss of function weakness or numbness.   Lack of health insurance: Patient has started the process of getting enrolled with [place] program.  Past Medical History:  Diagnosis Date  . Hypertension   . Obese    Past Surgical History:  Procedure Laterality Date  . CHOLECYSTECTOMY     Social History  Substance Use Topics  . Smoking status: Current Every Day Smoker  . Smokeless tobacco: Never Used  . Alcohol use No   family history includes Depression in her mother; Hyperlipidemia in her mother; Hypertension in her mother.  ROS as above:  Medications: Current Outpatient Prescriptions  Medication Sig Dispense Refill  . amLODipine (NORVASC) 10 MG tablet Take 1 tablet (10 mg total) by mouth daily. 30 tablet 11  . citalopram (CELEXA) 40 MG tablet Take 1 tablet (40 mg total) by mouth daily. 30 tablet 3  . hydrochlorothiazide (HYDRODIURIL) 25 MG tablet Take 1 tablet (25 mg total) by mouth daily. 30 tablet 11  . lisinopril (PRINIVIL,ZESTRIL) 20 MG tablet Take 1 tablet (20 mg total) by mouth daily. 30 tablet 11  . norgestimate-ethinyl  estradiol (ORTHO-CYCLEN,SPRINTEC,PREVIFEM) 0.25-35 MG-MCG tablet Take 1 tablet by mouth daily. Use as directed. 1 Package 11  . oxybutynin (DITROPAN) 5 MG tablet Take 1 tablet (5 mg total) by mouth 4 (four) times daily. 120 tablet 11  . ranitidine (ZANTAC) 300 MG tablet Take 1 tablet (300 mg total) by mouth 2 (two) times daily. 180 tablet 3  . traZODone (DESYREL) 50 MG tablet Take 0.5-1 tablets (25-50 mg total) by mouth at bedtime as needed for sleep. 30 tablet 11   No current facility-administered medications for this visit.    No Known Allergies  Health Maintenance Health Maintenance  Topic Date Due  . HIV Screening  12/25/1993  . TETANUS/TDAP  12/25/1997  . PAP SMEAR  12/26/1999  . INFLUENZA VACCINE  12/03/2016     Exam:  BP 132/74   Pulse 84   Wt (!) 355 lb (161 kg)   SpO2 98%   BMI 59.08 kg/m  Gen: Well NAD HEENT: EOMI,  MMM Lungs: Normal work of breathing. CTABL Heart: RRR no MRG Abd: NABS, Soft. Nondistended, Nontender Exts: Brisk capillary refill, warm and well perfused.    No results found for this or any previous visit (from the past 72 hour(s)). No results found.    Assessment and Plan: 36 y.o. female with  Headache: Likely related to sleep apnea. When patient returns to bring her CPAP machine and will try to program it with a ramp.  Continued blood pressure management and will continue to follow.  Acid reflux: Will use ranitidine  Heavy menstrual bleeding: Patient likely is having anovulatory cycles.  She's not taking the medroxyprogesterone correctly. We'll simplify her medication regime and use combined estrogen progesterone contraceptives.  If not better will we will try to refer to OB/GYN however this is a huge barrier because she does not have health insurance.  Recheck in 1 month.   No orders of the defined types were placed in this encounter.  Meds ordered this encounter  Medications  . norgestimate-ethinyl estradiol  (ORTHO-CYCLEN,SPRINTEC,PREVIFEM) 0.25-35 MG-MCG tablet    Sig: Take 1 tablet by mouth daily. Use as directed.    Dispense:  1 Package    Refill:  11  . ranitidine (ZANTAC) 300 MG tablet    Sig: Take 1 tablet (300 mg total) by mouth 2 (two) times daily.    Dispense:  180 tablet    Refill:  3     Discussed warning signs or symptoms. Please see discharge instructions. Patient expresses understanding.

## 2016-12-19 ENCOUNTER — Ambulatory Visit (INDEPENDENT_AMBULATORY_CARE_PROVIDER_SITE_OTHER): Payer: Self-pay | Admitting: Family Medicine

## 2016-12-19 ENCOUNTER — Encounter: Payer: Self-pay | Admitting: Family Medicine

## 2016-12-19 VITALS — BP 130/45 | HR 79 | Wt 361.0 lb

## 2016-12-19 DIAGNOSIS — F332 Major depressive disorder, recurrent severe without psychotic features: Secondary | ICD-10-CM

## 2016-12-19 DIAGNOSIS — I10 Essential (primary) hypertension: Secondary | ICD-10-CM

## 2016-12-19 DIAGNOSIS — N3281 Overactive bladder: Secondary | ICD-10-CM

## 2016-12-19 MED ORDER — PHENTERMINE HCL 37.5 MG PO TABS
ORAL_TABLET | ORAL | 0 refills | Status: DC
Start: 2016-12-19 — End: 2017-06-08

## 2016-12-19 NOTE — Progress Notes (Signed)
Lindsay Soto is a 36 y.o. female who presents to [facility]: [facility] today for follow-up of depression and hypertension and obesity.  Depression: Improved with citalopram. Patient has also been getting out more with her friends would certainly has helped. She denies any active SI or HI symptoms today.  Hypertension: Patient is doing well with the below medications with no chest pain palpitations or shortness of breath.  Obesity: Patient has struggled to lose weight and in fact is gaining weight. She is interested in phentermine if possible. She's done well with this medication in the past.    Past Medical History:  Diagnosis Date  . Hypertension   . Obese    Past Surgical History:  Procedure Laterality Date  . CHOLECYSTECTOMY     Social History  Substance Use Topics  . Smoking status: Current Every Day Smoker  . Smokeless tobacco: Never Used  . Alcohol use No   family history includes Depression in her mother; Hyperlipidemia in her mother; Hypertension in her mother.  ROS as above:  Medications: Current Outpatient Prescriptions  Medication Sig Dispense Refill  . amLODipine (NORVASC) 10 MG tablet Take 1 tablet (10 mg total) by mouth daily. 30 tablet 11  . citalopram (CELEXA) 40 MG tablet Take 1 tablet (40 mg total) by mouth daily. 30 tablet 3  . hydrochlorothiazide (HYDRODIURIL) 25 MG tablet Take 1 tablet (25 mg total) by mouth daily. 30 tablet 11  . lisinopril (PRINIVIL,ZESTRIL) 20 MG tablet Take 1 tablet (20 mg total) by mouth daily. 30 tablet 11  . norgestimate-ethinyl estradiol (ORTHO-CYCLEN,SPRINTEC,PREVIFEM) 0.25-35 MG-MCG tablet Take 1 tablet by mouth daily. Use as directed. 1 Package 11  . oxybutynin (DITROPAN) 5 MG tablet Take 1 tablet (5 mg total) by mouth 4 (four) times daily. 120 tablet 11  . ranitidine (ZANTAC) 300 MG tablet Take 1 tablet (300 mg  total) by mouth 2 (two) times daily. 180 tablet 3  . traZODone (DESYREL) 50 MG tablet Take 0.5-1 tablets (25-50 mg total) by mouth at bedtime as needed for sleep. 30 tablet 11   No current facility-administered medications for this visit.    No Known Allergies  Health Maintenance Health Maintenance  Topic Date Due  . HIV Screening  12/25/1993  . TETANUS/TDAP  12/25/1997  . PAP SMEAR  12/26/1999  . INFLUENZA VACCINE  12/03/2016     Exam:  BP (!) 130/45   Pulse 79   Wt (!) 361 lb (163.7 kg)   BMI 60.07 kg/m   Wt Readings from Last 10 Encounters:  12/19/16 (!) 361 lb (163.7 kg)  11/26/16 (!) 355 lb (161 kg)  10/20/16 (!) 355 lb (161 kg)  09/19/16 (!) 350 lb (158.8 kg)  08/21/16 (!) 350 lb (158.8 kg)  08/14/16 (!) 351 lb (159.2 kg)  05/16/16 (!) 345 lb (156.5 kg)  04/15/16 (!) 343 lb (155.6 kg)  01/28/16 (!) 346 lb (156.9 kg)  11/19/15 (!) 346 lb (156.9 kg)    Gen: Well NAD HEENT: EOMI,  MMM Lungs: Normal work of breathing. CTABL Heart: RRR no MRG Abd: NABS, Soft. Nondistended, Nontender Exts: Brisk capillary refill, warm and well perfused.  Psych: Alert and oriented normal speech thought process and affect. No active SI or HI.  Depression screen [facility] 2/9 12/19/2016 10/20/2016 09/19/2016 08/21/2016 08/14/2016  Decreased Interest 2 2 3 3 3   Down, Depressed, Hopeless 3 3 3 3 3   PHQ - 2 Score 5 5 6  6  6  Altered sleeping 2 3 3 3 3   Tired, decreased energy 3 3 3 3 3   Change in appetite 2 3 3 3 3   Feeling bad or failure about yourself  0 3 3 3 3   Trouble concentrating 0 2 0 0 0  Moving slowly or fidgety/restless 3 2 2 2 2   Suicidal thoughts 3 3 3 3 3   PHQ-9 Score 18 24 23 23 23       No results found for this or any previous visit (from the past 72 hour(s)). No results found.    Assessment and Plan: 36 y.o. female with  Hypertension well-controlled continue current regimen.  Depression improving continue to follow along.  Obesity: Patient continues to gain  weight. We discussed food logging and a normal calorie diet. Plan for phentermine and recheck in one month.   No orders of the defined types were placed in this encounter.  No orders of the defined types were placed in this encounter.    Discussed warning signs or symptoms. Please see discharge instructions. Patient expresses understanding.

## 2016-12-19 NOTE — Patient Instructions (Signed)
Thank you for coming in today. Use phenetermine Recheck with me in 1 month.  Bring your food log with that visit.   Phentermine tablets or capsules What is this medicine? PHENTERMINE (FEN ter meen) decreases your appetite. It is used with a reduced calorie diet and exercise to help you lose weight. This medicine may be used for other purposes; ask your health care provider or pharmacist if you have questions. COMMON BRAND NAME(S): Adipex-P, Atti-Plex P, Atti-Plex P Spansule, Fastin, Lomaira, Pro-Fast, Tara-8 What should I tell my health care provider before I take this medicine? They need to know if you have any of these conditions: -agitation -glaucoma -heart disease -high blood pressure -history of substance abuse -lung disease called Primary Pulmonary Hypertension (PPH) -taken an MAOI like Carbex, Eldepryl, Marplan, Nardil, or Parnate in last 14 days -thyroid disease -an unusual or allergic reaction to phentermine, other medicines, foods, dyes, or preservatives -pregnant or trying to get pregnant -breast-feeding How should I use this medicine? Take this medicine by mouth with a glass of water. Follow the directions on the prescription label. The instructions for use may differ based on the product and dose you are taking. Avoid taking this medicine in the evening. It may interfere with sleep. Take your doses at regular intervals. Do not take your medicine more often than directed. Talk to your pediatrician regarding the use of this medicine in children. While this drug may be prescribed for children 17 years or older for selected conditions, precautions do apply. Overdosage: If you think you have taken too much of this medicine contact a poison control center or emergency room at once. NOTE: This medicine is only for you. Do not share this medicine with others. What if I miss a dose? If you miss a dose, take it as soon as you can. If it is almost time for your next dose, take only  that dose. Do not take double or extra doses. What may interact with this medicine? Do not take this medicine with any of the following medications: -duloxetine -MAOIs like Carbex, Eldepryl, Marplan, Nardil, and Parnate -medicines for colds or breathing difficulties like pseudoephedrine or phenylephrine -procarbazine -sibutramine -SSRIs like citalopram, escitalopram, fluoxetine, fluvoxamine, paroxetine, and sertraline -stimulants like dexmethylphenidate, methylphenidate or modafinil -venlafaxine This medicine may also interact with the following medications: -medicines for diabetes This list may not describe all possible interactions. Give your health care provider a list of all the medicines, herbs, non-prescription drugs, or dietary supplements you use. Also tell them if you smoke, drink alcohol, or use illegal drugs. Some items may interact with your medicine. What should I watch for while using this medicine? Notify your physician immediately if you become short of breath while doing your normal activities. Do not take this medicine within 6 hours of bedtime. It can keep you from getting to sleep. Avoid drinks that contain caffeine and try to stick to a regular bedtime every night. This medicine was intended to be used in addition to a healthy diet and exercise. The best results are achieved this way. This medicine is only indicated for short-term use. Eventually your weight loss may level out. At that point, the drug will only help you maintain your new weight. Do not increase or in any way change your dose without consulting your doctor. You may get drowsy or dizzy. Do not drive, use machinery, or do anything that needs mental alertness until you know how this medicine affects you. Do not stand or sit up quickly,  especially if you are an older patient. This reduces the risk of dizzy or fainting spells. Alcohol may increase dizziness and drowsiness. Avoid alcoholic drinks. What side effects  may I notice from receiving this medicine? Side effects that you should report to your doctor or health care professional as soon as possible: -chest pain, palpitations -depression or severe changes in mood -increased blood pressure -irritability -nervousness or restlessness -severe dizziness -shortness of breath -problems urinating -unusual swelling of the legs -vomiting Side effects that usually do not require medical attention (report to your doctor or health care professional if they continue or are bothersome): -blurred vision or other eye problems -changes in sexual ability or desire -constipation or diarrhea -difficulty sleeping -dry mouth or unpleasant taste -headache -nausea This list may not describe all possible side effects. Call your doctor for medical advice about side effects. You may report side effects to FDA at 1-800-FDA-1088. Where should I keep my medicine? Keep out of the reach of children. This medicine can be abused. Keep your medicine in a safe place to protect it from theft. Do not share this medicine with anyone. Selling or giving away this medicine is dangerous and against the law. This medicine may cause accidental overdose and death if taken by other adults, children, or pets. Mix any unused medicine with a substance like cat litter or coffee grounds. Then throw the medicine away in a sealed container like a sealed bag or a coffee can with a lid. Do not use the medicine after the expiration date. Store at room temperature between 20 and 25 degrees C (68 and 77 degrees F). Keep container tightly closed. NOTE: This sheet is a summary. It may not cover all possible information. If you have questions about this medicine, talk to your doctor, pharmacist, or health care provider.  2018 Elsevier/Gold Standard (2015-01-26 12:53:15)

## 2016-12-23 ENCOUNTER — Encounter: Payer: Self-pay | Admitting: Family Medicine

## 2016-12-23 ENCOUNTER — Ambulatory Visit (INDEPENDENT_AMBULATORY_CARE_PROVIDER_SITE_OTHER): Payer: Self-pay | Admitting: Family Medicine

## 2016-12-23 DIAGNOSIS — D171 Benign lipomatous neoplasm of skin and subcutaneous tissue of trunk: Secondary | ICD-10-CM

## 2016-12-23 NOTE — Patient Instructions (Signed)
Thank you for coming in today.  I think those little bumps are lipomas.  They should not bother you much.   Lipoma A lipoma is a noncancerous (benign) tumor that is made up of fat cells. This is a very common type of soft-tissue growth. Lipomas are usually found under the skin (subcutaneous). They may occur in any tissue of the body that contains fat. Common areas for lipomas to appear include the back, shoulders, buttocks, and thighs. Lipomas grow slowly, and they are usually painless. Most lipomas do not cause problems and do not require treatment. What are the causes? The cause of this condition is not known. What increases the risk? This condition is more likely to develop in:  People who are 75-60 years old.  People who have a family history of lipomas.  What are the signs or symptoms? A lipoma usually appears as a small, round bump under the skin. It may feel soft or rubbery, but the firmness can vary. Most lipomas are not painful. However, a lipoma may become painful if it is located in an area where it pushes on nerves. How is this diagnosed? A lipoma can usually be diagnosed with a physical exam. You may also have tests to confirm the diagnosis and to rule out other conditions. Tests may include:  Imaging tests, such as a CT scan or MRI.  Removal of a tissue sample to be looked at under a microscope (biopsy).  How is this treated? Treatment is not needed for small lipomas that are not causing problems. If a lipoma continues to get bigger or it causes problems, removal is often the best option. Lipomas can also be removed to improve appearance. Removal of a lipoma is usually done with a surgery in which the fatty cells and the surrounding capsule are removed. Most often, a medicine that numbs the area (local anesthetic) is used for this procedure. Follow these instructions at home:  Keep all follow-up visits as directed by your health care provider. This is important. Contact a  health care provider if:  Your lipoma becomes larger or hard.  Your lipoma becomes painful, red, or increasingly swollen. These could be signs of infection or a more serious condition. This information is not intended to replace advice given to you by your health care provider. Make sure you discuss any questions you have with your health care provider. Document Released: 04/11/2002 Document Revised: 09/27/2015 Document Reviewed: 04/17/2014 Elsevier Interactive Patient Education  [employer].

## 2016-12-24 DIAGNOSIS — D171 Benign lipomatous neoplasm of skin and subcutaneous tissue of trunk: Secondary | ICD-10-CM | POA: Insufficient documentation

## 2016-12-24 NOTE — Progress Notes (Signed)
Lindsay Soto is a 36 y.o. female who presents to [facility] today for bumps on abdomen. Patient discovered 3 small subcutaneous nodules on her abdomen/pannus over the last several days. These are not particularly tender. She denies any fevers chills vomiting or new diarrhea. She does note some abdominal fullness and fatigue however. She notes the symptoms have been ongoing for quite some time and preceded the abdominal bumps.   Past Medical History:  Diagnosis Date  . Hypertension   . Obese    Past Surgical History:  Procedure Laterality Date  . CHOLECYSTECTOMY     Social History  Substance Use Topics  . Smoking status: Current Every Day Smoker  . Smokeless tobacco: Never Used  . Alcohol use No   family history includes Depression in her mother; Hyperlipidemia in her mother; Hypertension in her mother.  ROS as above:  Medications: Current Outpatient Prescriptions  Medication Sig Dispense Refill  . amLODipine (NORVASC) 10 MG tablet Take 1 tablet (10 mg total) by mouth daily. 30 tablet 11  . citalopram (CELEXA) 40 MG tablet Take 1 tablet (40 mg total) by mouth daily. 30 tablet 3  . hydrochlorothiazide (HYDRODIURIL) 25 MG tablet Take 1 tablet (25 mg total) by mouth daily. 30 tablet 11  . lisinopril (PRINIVIL,ZESTRIL) 20 MG tablet Take 1 tablet (20 mg total) by mouth daily. 30 tablet 11  . norgestimate-ethinyl estradiol (ORTHO-CYCLEN,SPRINTEC,PREVIFEM) 0.25-35 MG-MCG tablet Take 1 tablet by mouth daily. Use as directed. 1 Package 11  . oxybutynin (DITROPAN) 5 MG tablet Take 1 tablet (5 mg total) by mouth 4 (four) times daily. 120 tablet 11  . phentermine (ADIPEX-P) 37.5 MG tablet One tab by mouth qAM 30 tablet 0  . ranitidine (ZANTAC) 300 MG tablet Take 1 tablet (300 mg total) by mouth 2 (two) times daily. 180 tablet 3  . traZODone (DESYREL) 50 MG tablet Take  0.5-1 tablets (25-50 mg total) by mouth at bedtime as needed for sleep. 30 tablet 11   No current facility-administered medications for this visit.    No Known Allergies  Health Maintenance Health Maintenance  Topic Date Due  . HIV Screening  12/25/1993  . TETANUS/TDAP  12/25/1997  . PAP SMEAR  12/26/1999  . INFLUENZA VACCINE  12/03/2016     Exam:  BP 136/85   Pulse 98   Temp 98.3 F (36.8 C) (Oral)   Wt (!) 364 lb (165.1 kg)   BMI 60.57 kg/m  Gen: Well NAD HEENT: EOMI,  MMM Lungs: Normal work of breathing. CTABL Heart: RRR no MRG Abd: NABS, Soft. Nondistended, Nontender Exts: Brisk capillary refill, warm and well perfused.  Skin on pain numbness has 3 small mobile subcutaneous nodules consistent with lipoma.   No results found for this or any previous visit (from the past 72 hour(s)). No results found.    Assessment and Plan: 36 y.o. female with  Lipoma. The 3 small nodules or lipomas very likely. We had a lengthy discussion about this and plan to proceed with watchful waiting.  Patient was unable to provide a urine sample in the clinic for urine pregnancy test to evaluate the fatigue and nausea. I recommended at-home pregnancy test in the near future.   No orders of the defined types were placed in this encounter.  No orders of the defined types were placed in this encounter.    Discussed warning signs or symptoms. Please see discharge instructions. Patient expresses understanding.  I spent 25 minutes with this patient, greater than 50% was face-to-face time counseling regarding what exactly a lipoma is and what to do about it and watchful waiting.Marland Kitchen

## 2017-01-19 ENCOUNTER — Ambulatory Visit: Payer: Self-pay | Admitting: Family Medicine

## 2017-01-20 ENCOUNTER — Ambulatory Visit: Payer: Self-pay

## 2017-02-04 ENCOUNTER — Ambulatory Visit (INDEPENDENT_AMBULATORY_CARE_PROVIDER_SITE_OTHER): Payer: Self-pay | Admitting: Family Medicine

## 2017-02-04 ENCOUNTER — Encounter: Payer: Self-pay | Admitting: Family Medicine

## 2017-02-04 VITALS — BP 164/73 | HR 104 | Ht 65.0 in | Wt 364.0 lb

## 2017-02-04 DIAGNOSIS — F332 Major depressive disorder, recurrent severe without psychotic features: Secondary | ICD-10-CM

## 2017-02-04 DIAGNOSIS — N926 Irregular menstruation, unspecified: Secondary | ICD-10-CM

## 2017-02-04 DIAGNOSIS — I10 Essential (primary) hypertension: Secondary | ICD-10-CM

## 2017-02-04 DIAGNOSIS — Z23 Encounter for immunization: Secondary | ICD-10-CM

## 2017-02-04 LAB — POCT URINE PREGNANCY: Preg Test, Ur: NEGATIVE

## 2017-02-04 NOTE — Progress Notes (Signed)
Lindsay Soto is a 36 y.o. female who presents to [facility] today for hypertension and amenorrhea.  Any missed a menstrual period this month. Her last menstrual period was August 27. She had a positive home pregnancy test last week. She has had unprotected sex. She did not intend to become pregnant.  Hypertension: Patient has hypertension typically controlled with chlorothiazide and lisinopril. She cannot afford her medications and is not currently taking any of them.  Depression and anxiety: Patient symptoms are typically controlled with Celexa however she has not been taking them because she cannot afford them.   Past Medical History:  Diagnosis Date  . Hypertension   . Obese    Past Surgical History:  Procedure Laterality Date  . CHOLECYSTECTOMY     Social History  Substance Use Topics  . Smoking status: Current Every Day Smoker  . Smokeless tobacco: Never Used  . Alcohol use No   family history includes Depression in her mother; Hyperlipidemia in her mother; Hypertension in her mother.  ROS as above:  Medications: Current Outpatient Prescriptions  Medication Sig Dispense Refill  . amLODipine (NORVASC) 10 MG tablet Take 1 tablet (10 mg total) by mouth daily. 30 tablet 11  . citalopram (CELEXA) 40 MG tablet Take 1 tablet (40 mg total) by mouth daily. 30 tablet 3  . hydrochlorothiazide (HYDRODIURIL) 25 MG tablet Take 1 tablet (25 mg total) by mouth daily. 30 tablet 11  . lisinopril (PRINIVIL,ZESTRIL) 20 MG tablet Take 1 tablet (20 mg total) by mouth daily. 30 tablet 11  . norgestimate-ethinyl estradiol (ORTHO-CYCLEN,SPRINTEC,PREVIFEM) 0.25-35 MG-MCG tablet Take 1 tablet by mouth daily. Use as directed. 1 Package 11  . oxybutynin (DITROPAN) 5 MG tablet Take 1 tablet (5 mg total) by mouth 4 (four) times daily. 120 tablet 11  . phentermine (ADIPEX-P) 37.5 MG  tablet One tab by mouth qAM 30 tablet 0  . ranitidine (ZANTAC) 300 MG tablet Take 1 tablet (300 mg total) by mouth 2 (two) times daily. 180 tablet 3  . traZODone (DESYREL) 50 MG tablet Take 0.5-1 tablets (25-50 mg total) by mouth at bedtime as needed for sleep. 30 tablet 11   No current facility-administered medications for this visit.    No Known Allergies  Health Maintenance Health Maintenance  Topic Date Due  . HIV Screening  12/25/1993  . TETANUS/TDAP  12/25/1997  . PAP SMEAR  12/26/1999  . INFLUENZA VACCINE  12/03/2016     Exam:  BP (!) 164/73   Pulse (!) 104   Ht 5\' 5"  (1.651 m)   Wt (!) 364 lb (165.1 kg)   BMI 60.57 kg/m  Gen: Well NAD HEENT: EOMI,  MMM Lungs: Normal work of breathing. CTABL Heart: RRR no MRG Abd: NABS, Soft. Nondistended, Nontender Exts: Brisk capillary refill, warm and well perfused.  Psych alert and oriented normal speech thought process and affect  Depression screen [facility] 2/9 02/04/2017 12/19/2016 10/20/2016 09/19/2016 08/21/2016  Decreased Interest 2 2 2 3 3   Down, Depressed, Hopeless 3 3 3 3 3   PHQ - 2 Score 5 5 5 6 6   Altered sleeping 3 2 3 3 3   Tired, decreased energy 3 3 3 3 3   Change in appetite 3 2 3 3 3   Feeling bad or failure about yourself  3 0 3 3 3   Trouble concentrating 0 0 2 0 0  Moving slowly or fidgety/restless 0 3 2 2 2   Suicidal thoughts  3 3 3 3 3   PHQ-9 Score 20 18 24 23 23   Difficult doing work/chores Somewhat difficult - - - -   GAD 7 : Generalized Anxiety Score 02/04/2017 10/20/2016 09/19/2016 08/21/2016  Nervous, Anxious, on Edge 3 3 3 3   Control/stop worrying 3 3 3 3   Worry too much - different things 3 2 2 2   Trouble relaxing 3 3 3 2   Restless 0 2 3 3   Easily annoyed or irritable 3 3 3 3   Afraid - awful might happen 3 3 3 3   Total GAD 7 Score 18 19 20 19   Anxiety Difficulty Somewhat difficult - - Very difficult       Results for orders placed or performed in visit on 02/04/17 (from the past 72 hour(s))  POCT  urine pregnancy     Status: Normal   Collection Time: 02/04/17 11:03 AM  Result Value Ref Range   Preg Test, Ur Negative Negative   No results found.    Assessment and Plan: 36 y.o. female with  Hypertension: Not well controlled. Patient cannot afford her medications. She is currently applying for Medicaid which I think will be helpful. We'll continue to wait until she can get her medicines.  Mood: At baseline. Recommend restarting Celexa when able. Medicaid should help.  Amenorrhea: Pregnancy test negative today. Recommend repeating all pregnancy test in one week if still positive we'll arrange for a serum hCG.  Flu vaccine given today.   Orders Placed This Encounter  Procedures  . Flu Vaccine QUAD 36+ mos IM  . POCT urine pregnancy   No orders of the defined types were placed in this encounter.    Discussed warning signs or symptoms. Please see discharge instructions. Patient expresses understanding.

## 2017-02-04 NOTE — Patient Instructions (Signed)
Thank you for coming in today. Let me know when you get approved for medicaid.  We can do medicine and labs then.  I recommend recheck the home urine pregnancy test in 1 week.  Let me know via a phon call if positive.     Lipoma A lipoma is a noncancerous (benign) tumor that is made up of fat cells. This is a very common type of soft-tissue growth. Lipomas are usually found under the skin (subcutaneous). They may occur in any tissue of the body that contains fat. Common areas for lipomas to appear include the back, shoulders, buttocks, and thighs. Lipomas grow slowly, and they are usually painless. Most lipomas do not cause problems and do not require treatment. What are the causes? The cause of this condition is not known. What increases the risk? This condition is more likely to develop in:  People who are 51-42 years old.  People who have a family history of lipomas.  What are the signs or symptoms? A lipoma usually appears as a small, round bump under the skin. It may feel soft or rubbery, but the firmness can vary. Most lipomas are not painful. However, a lipoma may become painful if it is located in an area where it pushes on nerves. How is this diagnosed? A lipoma can usually be diagnosed with a physical exam. You may also have tests to confirm the diagnosis and to rule out other conditions. Tests may include:  Imaging tests, such as a CT scan or MRI.  Removal of a tissue sample to be looked at under a microscope (biopsy).  How is this treated? Treatment is not needed for small lipomas that are not causing problems. If a lipoma continues to get bigger or it causes problems, removal is often the best option. Lipomas can also be removed to improve appearance. Removal of a lipoma is usually done with a surgery in which the fatty cells and the surrounding capsule are removed. Most often, a medicine that numbs the area (local anesthetic) is used for this procedure. Follow these  instructions at home:  Keep all follow-up visits as directed by your health care provider. This is important. Contact a health care provider if:  Your lipoma becomes larger or hard.  Your lipoma becomes painful, red, or increasingly swollen. These could be signs of infection or a more serious condition. This information is not intended to replace advice given to you by your health care provider. Make sure you discuss any questions you have with your health care provider. Document Released: 04/11/2002 Document Revised: 09/27/2015 Document Reviewed: 04/17/2014 Elsevier Interactive Patient Education  [employer].

## 2017-06-08 ENCOUNTER — Ambulatory Visit (INDEPENDENT_AMBULATORY_CARE_PROVIDER_SITE_OTHER): Payer: Self-pay | Admitting: Family Medicine

## 2017-06-08 ENCOUNTER — Encounter: Payer: Self-pay | Admitting: Family Medicine

## 2017-06-08 VITALS — BP 135/82 | HR 82 | Ht 65.0 in | Wt 371.0 lb

## 2017-06-08 DIAGNOSIS — I10 Essential (primary) hypertension: Secondary | ICD-10-CM

## 2017-06-08 DIAGNOSIS — M25562 Pain in left knee: Secondary | ICD-10-CM

## 2017-06-08 DIAGNOSIS — F332 Major depressive disorder, recurrent severe without psychotic features: Secondary | ICD-10-CM

## 2017-06-08 DIAGNOSIS — N3281 Overactive bladder: Secondary | ICD-10-CM

## 2017-06-08 MED ORDER — CITALOPRAM HYDROBROMIDE 40 MG PO TABS: 40.0000 mg | ORAL_TABLET | Freq: Every day | ORAL | 3 refills | Status: DC

## 2017-06-08 MED ORDER — OXYBUTYNIN CHLORIDE 5 MG PO TABS: 5.0000 mg | ORAL_TABLET | Freq: Four times a day (QID) | ORAL | 11 refills | Status: DC

## 2017-06-08 MED ORDER — LISINOPRIL 20 MG PO TABS: 20.0000 mg | ORAL_TABLET | Freq: Every day | ORAL | 11 refills | Status: DC

## 2017-06-08 MED ORDER — AMLODIPINE BESYLATE 10 MG PO TABS: 10.0000 mg | ORAL_TABLET | Freq: Every day | ORAL | 11 refills | Status: DC

## 2017-06-08 MED ORDER — HYDROCHLOROTHIAZIDE 25 MG PO TABS: 25.0000 mg | ORAL_TABLET | Freq: Every day | ORAL | 11 refills | Status: DC

## 2017-06-08 MED ORDER — TRAZODONE HCL 50 MG PO TABS: 25.0000 mg | ORAL_TABLET | Freq: Every evening | ORAL | 11 refills | Status: DC | PRN

## 2017-06-08 MED ORDER — RANITIDINE HCL 300 MG PO TABS: 300.0000 mg | ORAL_TABLET | Freq: Two times a day (BID) | ORAL | 3 refills | Status: DC

## 2017-06-08 MED ORDER — MELOXICAM 15 MG PO TABS: 15.0000 mg | ORAL_TABLET | Freq: Every day | ORAL | 2 refills | Status: DC

## 2017-06-08 NOTE — Progress Notes (Signed)
Lindsay Soto is a 37 y.o. female who presents to [facility] today for HTN, Obesity, Depression and knee pain.   Lindsay Soto is currently applying for medicaid/disbility and has a hearing next month. She would like to delay potential expensive workup and labs and xray until after she gets medicaid if possible.   HTN: Currently taking medicine listed below and doing well. No chest pain or palpitations or SOB.   Depression Doing reasonably well on Celexa and trazodone. No active SI or HI.   Knee Pain:  Lindsay Soto notes left knee pain ongoing for 1 month with no injury. She notes occasional locking or catching. She notes that the pain is worse with activity and better with rest.   Past Medical History:  Diagnosis Date  . Hypertension   . Obese    Past Surgical History:  Procedure Laterality Date  . CHOLECYSTECTOMY     Social History   Tobacco Use  . Smoking status: Current Every Day Smoker  . Smokeless tobacco: Never Used  Substance Use Topics  . Alcohol use: No   family history includes Depression in her mother; Hyperlipidemia in her mother; Hypertension in her mother.  ROS as above:  Medications: Current Outpatient Medications  Medication Sig Dispense Refill  . amLODipine (NORVASC) 10 MG tablet Take 1 tablet (10 mg total) by mouth daily. 30 tablet 11  . citalopram (CELEXA) 40 MG tablet Take 1 tablet (40 mg total) by mouth daily. 30 tablet 3  . hydrochlorothiazide (HYDRODIURIL) 25 MG tablet Take 1 tablet (25 mg total) by mouth daily. 30 tablet 11  . lisinopril (PRINIVIL,ZESTRIL) 20 MG tablet Take 1 tablet (20 mg total) by mouth daily. 30 tablet 11  . norgestimate-ethinyl estradiol (ORTHO-CYCLEN,SPRINTEC,PREVIFEM) 0.25-35 MG-MCG tablet Take 1 tablet by mouth daily. Use as directed. 1 Package 11  . oxybutynin (DITROPAN) 5 MG tablet Take 1 tablet (5 mg total) by mouth 4  (four) times daily. 120 tablet 11  . phentermine (ADIPEX-P) 37.5 MG tablet One tab by mouth qAM 30 tablet 0  . ranitidine (ZANTAC) 300 MG tablet Take 1 tablet (300 mg total) by mouth 2 (two) times daily. 180 tablet 3  . traZODone (DESYREL) 50 MG tablet Take 0.5-1 tablets (25-50 mg total) by mouth at bedtime as needed for sleep. 30 tablet 11   No current facility-administered medications for this visit.    No Known Allergies  Health Maintenance Health Maintenance  Topic Date Due  . HIV Screening  12/25/1993  . TETANUS/TDAP  12/25/1997  . PAP SMEAR  12/26/1999  . INFLUENZA VACCINE  Completed     Exam:  BP 135/82   Pulse 82   Ht 5\' 5"  (1.651 m)   Wt (!) 371 lb (168.3 kg)   BMI 61.74 kg/m  Gen: Well NAD Obese HEENT: EOMI,  MMM Lungs: Normal work of breathing. CTABL Heart: RRR no MRG Abd: NABS, Soft. Nondistended, Nontender Exts: Brisk capillary refill, warm and well perfused.  MSK: Left knee. Obese.  ROM 0-100 deg Non-tender Stable ligament exam Psych: Alert and oriented. Normal speech and affect. No SI/HI.  Depression screen [facility] 2/9 02/04/2017 12/19/2016 10/20/2016 09/19/2016 08/21/2016  Decreased Interest 2 2 2 3 3   Down, Depressed, Hopeless 3 3 3 3 3   PHQ - 2 Score 5 5 5 6 6   Altered sleeping 3 2 3 3 3   Tired, decreased energy 3 3 3 3 3   Change in appetite 3 2  3 3 3   Feeling bad or failure about yourself  3 0 3 3 3   Trouble concentrating 0 0 2 0 0  Moving slowly or fidgety/restless 0 3 2 2 2   Suicidal thoughts 3 3 3 3 3   PHQ-9 Score 20 18 24 23 23   Difficult doing work/chores Somewhat difficult - - - -       No results found for this or any previous visit (from the past 72 hour(s)). No results found.    Assessment and Plan: 37 y.o. female with  HTN: Doing well. Continue current medicine. Recheck in 1-3 months.   Depression: Doing well continue current plan and recheck in 1-3 months.   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 Tx meloxicam and follow  up with xray in 1-3 months. H2 blocker for GI PPI  No orders of the defined types were placed in this encounter.  No orders of the defined types were placed in this encounter.    Discussed warning signs or symptoms. Please see discharge instructions. Patient expresses understanding.

## 2017-06-08 NOTE — Patient Instructions (Signed)
Thank you for coming in today. Continue medicine.  If you get medicaid come back sooner so we can look into all your problems closer.  We will want to do labs and xray.  We will look at your knee more closely.   Otherwise recheck in 3 months.   Try taking over the counter Diphenhydramine (Benadryl). This should help the ear as well as sleep.   Lipoma A lipoma is a noncancerous (benign) tumor that is made up of fat cells. This is a very common type of soft-tissue growth. Lipomas are usually found under the skin (subcutaneous). They may occur in any tissue of the body that contains fat. Common areas for lipomas to appear include the back, shoulders, buttocks, and thighs. Lipomas grow slowly, and they are usually painless. Most lipomas do not cause problems and do not require treatment. What are the causes? The cause of this condition is not known. What increases the risk? This condition is more likely to develop in:  People who are 26-2 years old.  People who have a family history of lipomas.  What are the signs or symptoms? A lipoma usually appears as a small, round bump under the skin. It may feel soft or rubbery, but the firmness can vary. Most lipomas are not painful. However, a lipoma may become painful if it is located in an area where it pushes on nerves. How is this diagnosed? A lipoma can usually be diagnosed with a physical exam. You may also have tests to confirm the diagnosis and to rule out other conditions. Tests may include:  Imaging tests, such as a CT scan or MRI.  Removal of a tissue sample to be looked at under a microscope (biopsy).  How is this treated? Treatment is not needed for small lipomas that are not causing problems. If a lipoma continues to get bigger or it causes problems, removal is often the best option. Lipomas can also be removed to improve appearance. Removal of a lipoma is usually done with a surgery in which the fatty cells and the surrounding  capsule are removed. Most often, a medicine that numbs the area (local anesthetic) is used for this procedure. Follow these instructions at home:  Keep all follow-up visits as directed by your health care provider. This is important. Contact a health care provider if:  Your lipoma becomes larger or hard.  Your lipoma becomes painful, red, or increasingly swollen. These could be signs of infection or a more serious condition. This information is not intended to replace advice given to you by your health care provider. Make sure you discuss any questions you have with your health care provider. Document Released: 04/11/2002 Document Revised: 09/27/2015 Document Reviewed: 04/17/2014 Elsevier Interactive Patient Education  [employer].

## 2017-06-15 ENCOUNTER — Telehealth: Payer: Self-pay | Admitting: Family Medicine

## 2017-06-15 MED ORDER — BENZONATATE 200 MG PO CAPS: 200.0000 mg | ORAL_CAPSULE | Freq: Three times a day (TID) | ORAL | 0 refills | Status: DC | PRN

## 2017-06-15 NOTE — Telephone Encounter (Signed)
Sadi has a cough after being sick.  Will send in [place].  She can also use sugar free cough drops that contain menthol.  She can also use over the counter Dextromethorphan.

## 2017-06-16 ENCOUNTER — Telehealth: Payer: Self-pay | Admitting: Family Medicine

## 2017-06-16 NOTE — Telephone Encounter (Signed)
Pt called and stated that she has on two separate occasion experienced very sharp shooting pain in the right side of her stomach that last for a while and she stated it hurt so bad it brought her to tears. She wanted to know if this could be caused by the tumors that she has in her stomach. Thanks

## 2017-06-17 NOTE — Telephone Encounter (Signed)
I do not think is the lipomas in the stomach.  But if you are having severe pain we should look into more details.  Return as needed.

## 2017-07-23 ENCOUNTER — Telehealth: Payer: Self-pay | Admitting: Family Medicine

## 2017-07-23 NOTE — Telephone Encounter (Signed)
Patient called to request that a letter be faxed to her lawyer's office detailing her medical issues and how they prevent her from working. She stated that her IBS has become worse, and she goes to the restroom approximately 15 times a day. There are also times that she has accidents because she cannot make it to the restroom in time. She has scheduled an appointment with you at the beginning of April to discuss these ongoing IBS issues. She also stated that she is unable to walk or stand for extended periods of time. Please advise. Thank you!  Lawyer: Cameron Ali Fax Number: [phone]

## 2017-07-24 ENCOUNTER — Encounter: Payer: Self-pay | Admitting: Family Medicine

## 2017-07-24 NOTE — Telephone Encounter (Signed)
Left detailed message on patient vm with information as noted below. Mathieu Schloemer,CMA

## 2017-07-24 NOTE — Telephone Encounter (Signed)
Letter written and faxed to return the office. We will discuss IBS at her follow-up visit in April. Please inform patient.

## 2017-08-05 ENCOUNTER — Ambulatory Visit (INDEPENDENT_AMBULATORY_CARE_PROVIDER_SITE_OTHER): Payer: Self-pay | Admitting: Family Medicine

## 2017-08-05 ENCOUNTER — Encounter: Payer: Self-pay | Admitting: Family Medicine

## 2017-08-05 VITALS — BP 139/82 | HR 75 | Ht 65.0 in | Wt 357.0 lb

## 2017-08-05 DIAGNOSIS — K580 Irritable bowel syndrome with diarrhea: Secondary | ICD-10-CM

## 2017-08-05 MED ORDER — DICYCLOMINE HCL 20 MG PO TABS: 20.0000 mg | ORAL_TABLET | Freq: Three times a day (TID) | ORAL | 12 refills | Status: DC

## 2017-08-05 NOTE — Patient Instructions (Signed)
Thank you for coming in today. Work on weight loss  Try to eat smaller meals.  Take bentyl with meals 4x daily as needed for diarrhea.  Let me know how you are doing.   Diet for Irritable Bowel Syndrome When you have irritable bowel syndrome (IBS), the foods you eat and your eating habits are very important. IBS may cause various symptoms, such as abdominal pain, constipation, or diarrhea. Choosing the right foods can help ease discomfort caused by these symptoms. Work with your health care provider and dietitian to find the best eating plan to help control your symptoms. What general guidelines do I need to follow?  Keep a food diary. This will help you identify foods that cause symptoms. Write down: ? What you eat and when. ? What symptoms you have. ? When symptoms occur in relation to your meals.  Avoid foods that cause symptoms. Talk with your dietitian about other ways to get the same nutrients that are in these foods.  Eat more foods that contain fiber. Take a fiber supplement if directed by your dietitian.  Eat your meals slowly, in a relaxed setting.  Aim to eat 5-6 small meals per day. Do not skip meals.  Drink enough fluids to keep your urine clear or pale yellow.  Ask your health care provider if you should take an over-the-counter probiotic during flare-ups to help restore healthy gut bacteria.  If you have cramping or diarrhea, try making your meals low in fat and high in carbohydrates. Examples of carbohydrates are pasta, rice, whole grain breads and cereals, fruits, and vegetables.  If dairy products cause your symptoms to flare up, try eating less of them. You might be able to handle yogurt better than other dairy products because it contains bacteria that help with digestion. What foods are not recommended? The following are some foods and drinks that may worsen your symptoms:  Fatty foods, such as [place] fries.  Milk products, such as cheese or ice  cream.  Chocolate.  Alcohol.  Products with caffeine, such as coffee.  Carbonated drinks, such as soda.  The items listed above may not be a complete list of foods and beverages to avoid. Contact your dietitian for more information. What foods are good sources of fiber? Your health care provider or dietitian may recommend that you eat more foods that contain fiber. Fiber can help reduce constipation and other IBS symptoms. Add foods with fiber to your diet a little at a time so that your body can get used to them. Too much fiber at once might cause gas and swelling of your abdomen. The following are some foods that are good sources of fiber:  Apples.  Peaches.  Pears.  Berries.  Figs.  Broccoli (raw).  Cabbage.  Carrots.  Raw peas.  Kidney beans.  Lima beans.  Whole grain bread.  Whole grain cereal.  Where to find more information: [employer] for Functional Gastrointestinal Disorders: www.iffgd.[employer] of Diabetes and Digestive and Kidney Diseases: NetworkAffair.co.za.aspx This information is not intended to replace advice given to you by your health care provider. Make sure you discuss any questions you have with your health care provider. Document Released: 07/12/2003 Document Revised: 09/27/2015 Document Reviewed: 07/22/2013 Elsevier Interactive Patient Education  2018 [place] Syndrome Dumping syndrome is a combination of symptoms caused when food passes through your stomach too quickly. Dumping syndrome is also called rapid gastric emptying. There are two types of dumping syndrome. Early dumping syndrome is  the most common type and causes symptoms that occur soon after eating. Late dumping syndrome causes symptoms that occur a few hours after eating. Some people have both types. When you eat and drink, your stomach needs time to begin the process of  digestion. Sometimes undigested food and stomach juices move too quickly into the next part of your digestive system (small intestine). This causes symptoms of early dumping syndrome. In other cases, your pancreas releases too much insulin because of large amounts of sugar moving into your small intestine. Insulin is a hormone that takes sugar from your bloodstream. If your blood sugar gets too low (hypoglycemia), you may develop symptoms of late dumping syndrome. What are the causes?  Early dumping syndrome is caused by large amounts of undigested food and liquids passing into the upper part of your small intestine.  Late dumping syndrome is caused by large amounts of sugar from your diet moving into your small intestine. What increases the risk? Certain types of stomach surgery can lead to dumping syndrome. You may be at risk for dumping syndrome if you have had:  Surgery for ulcers or stomach cancer.  Weight loss surgery (bariatric surgery).  Surgery to treat severe heartburn (gastroesophageal reflux).  What are the signs or symptoms? Symptoms may start soon after eating or a few hours later. Symptoms of early dumping syndrome may begin 10-30 minutes after a meal. They include:  Nausea.  Fullness or bloating.  Cramps.  Vomiting.  Feeling flushed.  Sweating.  Feeling dizzy.  A fast or irregular heartbeat (palpitations).  Diarrhea.  Headache.  Symptoms of late dumping syndrome usually happen 2-3 hours after a meal. They may include:  Dizziness.  Weakness.  Sweating.  Palpitations.  Hunger.  Confusion.  How is this diagnosed? Your health care provider may diagnose dumping syndrome based on your symptoms and medical history. A physical exam will be done. You may need to have certain tests to confirm a diagnosis of dumping syndrome. These may include:  Modified glucose tolerance test. This blood test measures insulin secretion after you have a certain type of  sugary (glucose) drink.  Gastric emptying test. This test involves eating a bland meal that includes a material that shows up easily on an X-ray. After eating the meal, you have X-rays to determine how long it takes the stomach to empty.  Upper endoscopy. In this examination, a health care provider uses a flexible telescope (endoscope) passed through your mouth and throat to check the inside of your stomach.  How is this treated? For most people, dumping syndrome can be managed with diet changes. You may need to change your diet, nutrition, and eating habits. Working with a dietitian can be helpful. Other treatments may include:  Medicine that slows stomach emptying and reduces insulin secretion. This medicine may be given by injection.  Medicine that slows down the digestion of sugar. This medicine is usually used to treat type 2 diabetes. Sometimes it can also relieve symptoms of late dumping syndrome.  Surgery. For severe cases of dumping syndrome, reconstructive stomach surgery may be needed if other treatments are not working.  Follow these instructions at home:  Take medicines only as directed by your health care provider.  Work with a Microbiologist to change your diet and maintain good nutrition.  Eat smaller meals more often. For example, eat six small meals a day instead of three large meals.  Eat foods that digest slowly. Make sure your diet includes plenty of: ? Fruits. ?  Vegetables. ? Whole grains.  Get lots of protein by eating foods such as eggs, poultry, and fish.  Eat slowly and chew your food completely.  Do not drink while you are eating. Drink fluids before and after meals instead.  Do not eat or drink anything with added sugar. This includes sweets and sugary drinks such as juice or soda.  Do not eat or drink dairy products if they make your symptoms worse.  Lie down for about 30 minutes after eating. Contact a health care provider if:  Your symptoms change or  get worse.  You have trouble maintaining a healthy weight. This information is not intended to replace advice given to you by your health care provider. Make sure you discuss any questions you have with your health care provider. Document Released: 03/14/2004 Document Revised: 09/27/2015 Document Reviewed: 07/18/2013 Elsevier Interactive Patient Education  [employer].

## 2017-08-05 NOTE — Progress Notes (Signed)
Diarrhea 15x daily                                                                      Lindsay Soto is a 37 y.o. female who presents to [facility] today for diarrhea.  Arian notes a long history of bothersome diarrhea.  She notes in her 73s she had worsening diarrhea following laparoscopic cholecystectomy.  About 10 years ago she had a extensive workup for this with no obvious culprit.  She describes upper endoscopy colonoscopy and what sounds like a CT scan with oral and IV contrast.  She was ultimately left with a diagnosis of IBS and no specific treatment.  She notes frequent bowel movements up to 20/day occurring in clusters.  She notes frequently she will have cramping and diarrhea following eating.  She denies vomiting or constipation.  She notes almost every day she has frequent bowel movements but some days are very severe.  She has tried reducing her intake of dairy and fried food which has not helped much.  She has tried Imodium which has only helped a little.   Past Medical History:  Diagnosis Date  . Hypertension   . IBS (irritable bowel syndrome) 04/30/2016  . Obese    Past Surgical History:  Procedure Laterality Date  . CHOLECYSTECTOMY     Social History   Tobacco Use  . Smoking status: Current Every Day Smoker  . Smokeless tobacco: Never Used  Substance Use Topics  . Alcohol use: No   family history includes Depression in her mother; Hyperlipidemia in her mother; Hypertension in her mother.  ROS as above:  Medications: Current Outpatient Medications  Medication Sig Dispense Refill  . amLODipine (NORVASC) 10 MG tablet Take 1 tablet (10 mg total) by mouth daily. 30 tablet 11  . benzonatate (TESSALON) 200 MG capsule Take 1 capsule (200 mg total) by mouth 3 (three) times daily as needed for cough. 45 capsule 0  . citalopram (CELEXA) 40 MG tablet Take 1 tablet (40 mg total) by mouth daily. 30 tablet 3  .  hydrochlorothiazide (HYDRODIURIL) 25 MG tablet Take 1 tablet (25 mg total) by mouth daily. 30 tablet 11  . lisinopril (PRINIVIL,ZESTRIL) 20 MG tablet Take 1 tablet (20 mg total) by mouth daily. 30 tablet 11  . meloxicam (MOBIC) 15 MG tablet Take 1 tablet (15 mg total) by mouth daily. 30 tablet 2  . oxybutynin (DITROPAN) 5 MG tablet Take 1 tablet (5 mg total) by mouth 4 (four) times daily. 120 tablet 11  . ranitidine (ZANTAC) 300 MG tablet Take 1 tablet (300 mg total) by mouth 2 (two) times daily. 180 tablet 3  . traZODone (DESYREL) 50 MG tablet Take 0.5-1 tablets (25-50 mg total) by mouth at bedtime as needed for sleep. 30 tablet 11  . dicyclomine (BENTYL) 20 MG tablet Take 1 tablet (20 mg total) by mouth 4 (four) times daily -  before meals and at bedtime. 120 tablet 12   No current facility-administered medications for this visit.    No Known Allergies  Health Maintenance Health Maintenance  Topic Date Due  . HIV Screening  12/25/1993  . TETANUS/TDAP  12/25/1997  . PAP SMEAR  12/26/1999  .  INFLUENZA VACCINE  12/03/2017     Exam:  BP 139/82   Pulse 75   Ht 5\' 5"  (1.651 m)   Wt (!) 357 lb (161.9 kg)   SpO2 98%   BMI 59.41 kg/m   Wt Readings from Last 5 Encounters:  08/05/17 (!) 357 lb (161.9 kg)  06/08/17 (!) 371 lb (168.3 kg)  02/04/17 (!) 364 lb (165.1 kg)  12/23/16 (!) 364 lb (165.1 kg)  12/19/16 (!) 361 lb (163.7 kg)    Gen: Morbidly obese but nontoxic appearing HEENT: EOMI,  MMM Lungs: Normal work of breathing. CTABL Heart: RRR no MRG Abd: NABS, Soft. Nondistended, Nontender Exts: Brisk capillary refill, warm and well perfused.     Chemistry      Component Value Date/Time   NA 136 09/26/2015 1107   K 3.9 09/26/2015 1107   CL 96 (L) 09/26/2015 1107   CO2 25 09/26/2015 1107   BUN 10 09/26/2015 1107   CREATININE 0.66 09/26/2015 1107      Component Value Date/Time   CALCIUM 9.4 09/26/2015 1107   ALKPHOS 76 08/27/2015 1528   AST 13 08/27/2015 1528   ALT 14  08/27/2015 1528   BILITOT 0.2 08/27/2015 1528         Assessment and Plan: 37 y.o. female with  Diarrhea associated with cramping following eating.  This is very likely IBS diarrheal type.  She may have a component of dumping syndrome or poor production of pancreatic enzymes.  However her lack of health insurance significantly limits my ability to complete her workup.  She is in the process of applying for disability and thereby Medicaid some optimistic we can complete her workup in the future.  However after reviewing her health history I think is very likely that this is IBS.  We discussed treatment options.  Plan for dicyclomine.  Recheck as needed.  Return sooner if needed.   No orders of the defined types were placed in this encounter.  Meds ordered this encounter  Medications  . dicyclomine (BENTYL) 20 MG tablet    Sig: Take 1 tablet (20 mg total) by mouth 4 (four) times daily -  before meals and at bedtime.    Dispense:  120 tablet    Refill:  12     Discussed warning signs or symptoms. Please see discharge instructions. Patient expresses understanding.

## 2017-09-02 ENCOUNTER — Ambulatory Visit: Payer: Self-pay | Admitting: Family Medicine

## 2018-05-19 ENCOUNTER — Ambulatory Visit (INDEPENDENT_AMBULATORY_CARE_PROVIDER_SITE_OTHER): Payer: Self-pay | Admitting: Family Medicine

## 2018-05-19 ENCOUNTER — Encounter: Payer: Self-pay | Admitting: Family Medicine

## 2018-05-19 VITALS — BP 167/84 | HR 80 | Ht 65.0 in | Wt 365.0 lb

## 2018-05-19 DIAGNOSIS — K580 Irritable bowel syndrome with diarrhea: Secondary | ICD-10-CM

## 2018-05-19 DIAGNOSIS — F332 Major depressive disorder, recurrent severe without psychotic features: Secondary | ICD-10-CM

## 2018-05-19 DIAGNOSIS — N912 Amenorrhea, unspecified: Secondary | ICD-10-CM

## 2018-05-19 DIAGNOSIS — N3281 Overactive bladder: Secondary | ICD-10-CM

## 2018-05-19 DIAGNOSIS — I10 Essential (primary) hypertension: Secondary | ICD-10-CM

## 2018-05-19 DIAGNOSIS — G4733 Obstructive sleep apnea (adult) (pediatric): Secondary | ICD-10-CM

## 2018-05-19 LAB — POCT URINE PREGNANCY: Preg Test, Ur: NEGATIVE

## 2018-05-19 MED ORDER — RANITIDINE HCL 300 MG PO TABS: 300.0000 mg | ORAL_TABLET | Freq: Two times a day (BID) | ORAL | 3 refills | Status: AC

## 2018-05-19 MED ORDER — AMLODIPINE BESYLATE 10 MG PO TABS: 10.0000 mg | ORAL_TABLET | Freq: Every day | ORAL | 11 refills | Status: DC

## 2018-05-19 MED ORDER — HYDROCHLOROTHIAZIDE 25 MG PO TABS: 25.0000 mg | ORAL_TABLET | Freq: Every day | ORAL | 12 refills | Status: AC

## 2018-05-19 MED ORDER — CITALOPRAM HYDROBROMIDE 40 MG PO TABS: 40.0000 mg | ORAL_TABLET | Freq: Every day | ORAL | 3 refills | Status: DC

## 2018-05-19 MED ORDER — LISINOPRIL-HYDROCHLOROTHIAZIDE 20-25 MG PO TABS: 1.0000 | ORAL_TABLET | Freq: Every day | ORAL | 12 refills | Status: DC

## 2018-05-19 MED ORDER — RANITIDINE HCL 300 MG PO TABS: 300.0000 mg | ORAL_TABLET | Freq: Two times a day (BID) | ORAL | 3 refills | Status: DC

## 2018-05-19 NOTE — Patient Instructions (Addendum)
Thank you for coming in today. Restart the blood pressure medicines.  Restart citalopram for depression and anxiety.  Take 1/2 daily for about 1 week then increase to a full pill.   Apply for [facility] contact your county [facility]  [address] [place], [place] [address] Phone: [phone]  Recheck with me in 1-3 months.

## 2018-05-19 NOTE — Progress Notes (Signed)
Lindsay Soto is a 38 y.o. female who presents to [place]: [place] today for follow-up mood, hypertension, irregular menstrual cycle/amenorrhea, obesity.  Lindsay Soto was last seen on April 2019.  In the interim she was somewhat lost to follow-up.  She does not have health insurance and has difficulty getting to the doctor.  She has not applied for Medicaid yet.  She moved to [facility].  Hypertension: Lindsay Soto previously was taking lisinopril hydrochlorothiazide and amlodipine.  She is run out of all of her medicines.  She denies chest pain palpitations or shortness of breath.  Mood: Lindsay Soto has a history of difficult to control anxiety and depression.  She previously was taking citalopram.  She is run out of this.  She thinks she was better controlled on citalopram than off of it.  She denies any active SI or HI.  Additionally Lindsay Soto notes that she has had irregular cycles off and on for years but has not had a period in a few months.  She has been having unprotected sex with her husband and is thinking she may be pregnant.  She would be happy with her pregnancy and is somewhat trying to become pregnant.  She does not take prenatal vitamins.  Additionally she continues to struggle with obesity.  She does not eat a careful diet or get regular exercise.   ROS as above:  Exam:  BP (!) 167/84   Pulse 80   Ht 5\' 5"  (1.651 m)   Wt (!) 365 lb (165.6 kg)   BMI 60.74 kg/m  Wt Readings from Last 5 Encounters:  05/19/18 (!) 365 lb (165.6 kg)  08/05/17 (!) 357 lb (161.9 kg)  06/08/17 (!) 371 lb (168.3 kg)  02/04/17 (!) 364 lb (165.1 kg)  12/23/16 (!) 364 lb (165.1 kg)    Gen: Well NAD HEENT: EOMI,  MMM Lungs: Normal work of breathing. CTABL Heart: RRR no MRG Abd: NABS, Soft. Nondistended, Nontender Exts: Brisk capillary refill, warm and well perfused.  Psych alert and oriented normal  speech thought process and affect.  Depression screen [facility] 2/9 05/19/2018 08/05/2017 02/04/2017 12/19/2016 10/20/2016  Decreased Interest 3 3 2 2 2   Down, Depressed, Hopeless 3 3 3 3 3   PHQ - 2 Score 6 6 5 5 5   Altered sleeping 3 3 3 2 3   Tired, decreased energy 3 3 3 3 3   Change in appetite 3 0 3 2 3   Feeling bad or failure about yourself  2 2 3  0 3  Trouble concentrating 0 0 0 0 2  Moving slowly or fidgety/restless 3 0 0 3 2  Suicidal thoughts 3 3 3 3 3   PHQ-9 Score 23 17 20 18 24   Difficult doing work/chores Very difficult Somewhat difficult Somewhat difficult - -  Some recent data might be hidden   GAD 7 : Generalized Anxiety Score 05/19/2018 08/05/2017 02/04/2017 10/20/2016  Nervous, Anxious, on Edge 3 3 3 3   Control/stop worrying 3 3 3 3   Worry too much - different things 3 3 3 2   Trouble relaxing 3 3 3 3   Restless 3 3 0 2  Easily annoyed or irritable 3 3 3 3   Afraid - awful might happen 3 3 3 3   Total GAD 7 Score 21 21 18 19   Anxiety Difficulty Extremely difficult Very difficult Somewhat difficult -      Lab and Radiology Results Results for orders placed or performed in visit on 05/19/18 (  from the past 72 hour(s))  POCT urine pregnancy     Status: None   Collection Time: 05/19/18  7:56 AM  Result Value Ref Range   Preg Test, Ur Negative Negative   No results found.    Assessment and Plan: 38 y.o. female with  Hypertension not controlled at all.  Plan to restart hydrochlorothiazide.  As patient is potentially trying to become pregnant avoid lisinopril and amlodipine.  Recheck 1 to 3 months.  Irregular cycles/amenorrhea: Urine pregnancy test negative today.  Recommend prenatal vitamins.  Recheck 1 to 3 months.  Encourage patient to apply for Medicaid I will be able to do more testing at that point and potentially refer to OB/GYN to further work-up for irregular cycle.  Mood: Not well controlled but stable.  Patient is cheerful today with normal affect.  Restart citalopram.   Should be safe for pregnancy.  Continue management for obesity overactive bladder and IBS.  PDMP reviewed during this encounter. Orders Placed This Encounter  Procedures  . POCT urine pregnancy   Meds ordered this encounter  Medications  . DISCONTD: amLODipine (NORVASC) 10 MG tablet    Sig: Take 1 tablet (10 mg total) by mouth daily. For blood pressure    Dispense:  30 tablet    Refill:  11  . DISCONTD: citalopram (CELEXA) 40 MG tablet    Sig: Take 1 tablet (40 mg total) by mouth daily. For depression/anxiety    Dispense:  30 tablet    Refill:  3  . DISCONTD: lisinopril-hydrochlorothiazide (PRINZIDE,ZESTORETIC) 20-25 MG tablet    Sig: Take 1 tablet by mouth daily. For blood pressure    Dispense:  30 tablet    Refill:  12  . DISCONTD: ranitidine (ZANTAC) 300 MG tablet    Sig: Take 1 tablet (300 mg total) by mouth 2 (two) times daily. For acid reflux    Dispense:  180 tablet    Refill:  3  . hydrochlorothiazide (HYDRODIURIL) 25 MG tablet    Sig: Take 1 tablet (25 mg total) by mouth daily. For blood pressure    Dispense:  30 tablet    Refill:  12  . citalopram (CELEXA) 40 MG tablet    Sig: Take 1 tablet (40 mg total) by mouth daily. For depression/anxiety    Dispense:  30 tablet    Refill:  3  . ranitidine (ZANTAC) 300 MG tablet    Sig: Take 1 tablet (300 mg total) by mouth 2 (two) times daily. For acid reflux    Dispense:  180 tablet    Refill:  3     Historical information moved to improve visibility of documentation.  Past Medical History:  Diagnosis Date  . Hypertension   . IBS (irritable bowel syndrome) 04/30/2016  . Obese    Past Surgical History:  Procedure Laterality Date  . CHOLECYSTECTOMY     Social History   Tobacco Use  . Smoking status: Current Every Day Smoker  . Smokeless tobacco: Never Used  Substance Use Topics  . Alcohol use: No   family history includes Depression in her mother; Hyperlipidemia in her mother; Hypertension in her  mother.  Medications: Current Outpatient Medications  Medication Sig Dispense Refill  . citalopram (CELEXA) 40 MG tablet Take 1 tablet (40 mg total) by mouth daily. For depression/anxiety 30 tablet 3  . ranitidine (ZANTAC) 300 MG tablet Take 1 tablet (300 mg total) by mouth 2 (two) times daily. For acid reflux 180 tablet 3  . hydrochlorothiazide (HYDRODIURIL)  25 MG tablet Take 1 tablet (25 mg total) by mouth daily. For blood pressure 30 tablet 12   No current facility-administered medications for this visit.    No Known Allergies   Discussed warning signs or symptoms. Please see discharge instructions. Patient expresses understanding.

## 2018-05-27 ENCOUNTER — Telehealth: Payer: Self-pay

## 2018-05-27 NOTE — Telephone Encounter (Signed)
Patient called and stated that her and Kasandra Knudsen need a letter stating that they are disabled and unable to work in order to get an apartment through [employer]. Patient stated that this was done  in the past for them. Please advise. Santosh Petter,CMA

## 2018-05-28 ENCOUNTER — Encounter: Payer: Self-pay | Admitting: Family Medicine

## 2018-05-28 NOTE — Telephone Encounter (Signed)
Patient has been advised. She want the letter faxed to [phone] attn. Sherlon Handing. Letter was faxed on 05/28/2018 and confirmation was received. Rhonda Cunningham,CMA

## 2018-05-28 NOTE — Telephone Encounter (Signed)
Letters are written and are ready for pickup or to be mailed.  What address would you like me to mail them to?

## 2018-08-11 ENCOUNTER — Ambulatory Visit: Payer: Self-pay | Admitting: Family Medicine

## 2018-10-21 ENCOUNTER — Ambulatory Visit (INDEPENDENT_AMBULATORY_CARE_PROVIDER_SITE_OTHER): Payer: Self-pay | Admitting: Family Medicine

## 2018-10-21 ENCOUNTER — Encounter: Payer: Self-pay | Admitting: Family Medicine

## 2018-10-21 VITALS — BP 141/68 | HR 85 | Temp 98.0°F | Wt 386.0 lb

## 2018-10-21 DIAGNOSIS — I10 Essential (primary) hypertension: Secondary | ICD-10-CM

## 2018-10-21 DIAGNOSIS — M545 Low back pain, unspecified: Secondary | ICD-10-CM

## 2018-10-21 DIAGNOSIS — K580 Irritable bowel syndrome with diarrhea: Secondary | ICD-10-CM

## 2018-10-21 DIAGNOSIS — R197 Diarrhea, unspecified: Secondary | ICD-10-CM

## 2018-10-21 DIAGNOSIS — N912 Amenorrhea, unspecified: Secondary | ICD-10-CM

## 2018-10-21 MED ORDER — MEDROXYPROGESTERONE ACETATE 10 MG PO TABS: ORAL_TABLET | ORAL | 12 refills | Status: AC

## 2018-10-21 MED ORDER — VIBERZI 100 MG PO TABS: 1.0000 | ORAL_TABLET | Freq: Two times a day (BID) | ORAL | 3 refills | Status: AC

## 2018-10-21 NOTE — Progress Notes (Signed)
Lindsay Soto is a 38 y.o. female who presents to [place]: [place] today for amenorrhea as well as follow-up hypertension and IBS/diarrhea.  Lindsay Soto has a history of irregular menstrual periods.  She has not had a menstrual cycle since October 2019.  She has had several home urine pregnancy tests recently that were all negative.  She does not have health insurance and has not had a full work-up for this issue yet.  Additionally for hypertension she takes medications listed below.  She denies chest pain palpitation shortness of breath lightheadedness or dizziness.  Additionally she notes continued diarrhea.  She has been evaluated for this in the past and thought to have IBS but has not had full work-up as well.  She is had trials of dicyclomine which helps a little but not very much.    ROS as above:  Exam:  BP (!) 141/68   Pulse 85   Temp 98 F (36.7 C) (Oral)   Wt (!) 386 lb (175.1 kg)   BMI 64.23 kg/m   Wt Readings from Last 5 Encounters:  10/21/18 (!) 386 lb (175.1 kg)  05/19/18 (!) 365 lb (165.6 kg)  08/05/17 (!) 357 lb (161.9 kg)  06/08/17 (!) 371 lb (168.3 kg)  02/04/17 (!) 364 lb (165.1 kg)     Gen: Well NAD morbidly obese HEENT: EOMI,  MMM Lungs: Normal work of breathing. CTABL Heart: RRR no MRG Abd: NABS, Soft. Nondistended, Nontender Exts: Brisk capillary refill, warm and well perfused.   Lab and Radiology Results No results found for this or any previous visit (from the past 72 hour(s)). No results found.    Assessment and Plan: 38 y.o. female with  Amenorrhea: Likely PCOS or anovulatory state.  Will proceed with metabolic work-up listed below if I am able to get labs.  Additionally will treat with 2 weeks of Provera every month.  Most importantly refer to OB/GYN for further work-up if able.  Hypertension: Blood pressure a bit elevated today but  reasonably controlled.  Again check labs and continue medication.  Diarrhea: Likely IBS type.  Patient needs a more thorough work-up with gastroenterology likely colonoscopy however currently she does not health insurance and has not a possibility.  Will continue metabolic work-up as below but also will try treating with Viberzi using patient assistance program.  Lastly 1 of the fundamental problems for Javana is her lack of health insurance.  She should qualify for [place] patient assistance program if she does not work and has not worked for a long time.  Encouraged her to apply again as this will allow me to work her up more thoroughly.   PDMP not reviewed this encounter. Orders Placed This Encounter  Procedures  . CBC  . COMPLETE METABOLIC PANEL WITH GFR  . Estrogens, total  . Progesterone  . FSH/LH  . Ambulatory referral to Obstetrics / Gynecology    Referral Priority:   Routine    Referral Type:   Consultation    Referral Reason:   Specialty Services Required    Requested Specialty:   Obstetrics and Gynecology    Number of Visits Requested:   1   Meds ordered this encounter  Medications  . medroxyPROGESTERone (PROVERA) 10 MG tablet    Sig: 1 pill daily for 14 days each month    Dispense:  14 tablet    Refill:  12  . Eluxadoline (VIBERZI) 100 MG TABS  Sig: Take 1 tablet (100 mg total) by mouth 2 (two) times daily.    Dispense:  180 tablet    Refill:  3     Historical information moved to improve visibility of documentation.  Past Medical History:  Diagnosis Date  . Hypertension   . IBS (irritable bowel syndrome) 04/30/2016  . Obese    Past Surgical History:  Procedure Laterality Date  . CHOLECYSTECTOMY     Social History   Tobacco Use  . Smoking status: Current Every Day Smoker  . Smokeless tobacco: Never Used  Substance Use Topics  . Alcohol use: No   family history includes Depression in her mother; Hyperlipidemia in her mother; Hypertension in her  mother.  Medications: Current Outpatient Medications  Medication Sig Dispense Refill  . citalopram (CELEXA) 40 MG tablet Take 1 tablet (40 mg total) by mouth daily. For depression/anxiety 30 tablet 3  . Eluxadoline (VIBERZI) 100 MG TABS Take 1 tablet (100 mg total) by mouth 2 (two) times daily. 180 tablet 3  . hydrochlorothiazide (HYDRODIURIL) 25 MG tablet Take 1 tablet (25 mg total) by mouth daily. For blood pressure 30 tablet 12  . medroxyPROGESTERone (PROVERA) 10 MG tablet 1 pill daily for 14 days each month 14 tablet 12  . ranitidine (ZANTAC) 300 MG tablet Take 1 tablet (300 mg total) by mouth 2 (two) times daily. For acid reflux 180 tablet 3   No current facility-administered medications for this visit.    No Known Allergies   Discussed warning signs or symptoms. Please see discharge instructions. Patient expresses understanding.

## 2018-10-21 NOTE — Patient Instructions (Signed)
Thank you for coming in today. Get labs now.  Apply for the patient assistance program from [facility].  Fill out the assistance program for the [facility] gut medicine.  You should hear about OBGYN.  Get labs soon.  Keep me updated.    Polycystic Ovarian Syndrome  Polycystic ovarian syndrome (PCOS) is a common hormonal disorder among women of reproductive age. In most women with PCOS, many small fluid-filled sacs (cysts) grow on the ovaries, and the cysts are not part of a normal menstrual cycle. PCOS can cause problems with your menstrual periods and make it difficult to get pregnant. It can also cause an increased risk of miscarriage with pregnancy. If it is not treated, PCOS can lead to serious health problems, such as diabetes and heart disease. What are the causes? The cause of PCOS is not known, but it may be the result of a combination of certain factors, such as:  Irregular menstrual cycle.  High levels of certain hormones (androgens).  Problems with the hormone that helps to control blood sugar (insulin resistance).  Certain genes. What increases the risk? This condition is more likely to develop in women who have a family history of PCOS. What are the signs or symptoms? Symptoms of PCOS may include:  Multiple ovarian cysts.  Infrequent periods or no periods.  Periods that are too frequent or too heavy.  Unpredictable periods.  Inability to get pregnant (infertility) because of not ovulating.  Increased growth of hair on the face, chest, stomach, back, thumbs, thighs, or toes.  Acne or oily skin. Acne may develop during adulthood, and it may not respond to treatment.  Pelvic pain.  Weight gain or obesity.  Patches of thickened and dark brown or black skin on the neck, arms, breasts, or thighs (acanthosis nigricans).  Excess hair growth on the face, chest, abdomen, or upper thighs (hirsutism). How is this diagnosed? This condition is diagnosed based on:  Your medical  history.  A physical exam, including a pelvic exam. Your health care provider may look for areas of increased hair growth on your skin.  Tests, such as: ? Ultrasound. This may be used to examine the ovaries and the lining of the uterus (endometrium) for cysts. ? Blood tests. These may be used to check levels of sugar (glucose), female hormone (testosterone), and female hormones (estrogen and progesterone) in your blood. How is this treated? There is no cure for PCOS, but treatment can help to manage symptoms and prevent more health problems from developing. Treatment varies depending on:  Your symptoms.  Whether you want to have a baby or whether you need birth control (contraception). Treatment may include nutrition and lifestyle changes along with:  Progesterone hormone to start a menstrual period.  Birth control pills to help you have regular menstrual periods.  Medicines to make you ovulate, if you want to get pregnant.  Medicine to reduce excessive hair growth.  Surgery, in severe cases. This may involve making small holes in one or both of your ovaries. This decreases the amount of testosterone that your body produces. Follow these instructions at home:  Take over-the-counter and prescription medicines only as told by your health care provider.  Follow a healthy meal plan. This can help you reduce the effects of PCOS. ? Eat a healthy diet that includes lean proteins, complex carbohydrates, fresh fruits and vegetables, low-fat dairy products, and healthy fats. Make sure to eat enough fiber.  If you are overweight, lose weight as told by your health  care provider. ? Losing 10% of your body weight may improve symptoms. ? Your health care provider can determine how much weight loss is best for you and can help you lose weight safely.  Keep all follow-up visits as told by your health care provider. This is important. Contact a health care provider if:  Your symptoms do not get  better with medicine.  You develop new symptoms. This information is not intended to replace advice given to you by your health care provider. Make sure you discuss any questions you have with your health care provider. Document Released: 08/15/2004 Document Revised: 12/18/2015 Document Reviewed: 10/07/2015 Elsevier Interactive Patient Education  2019 [employer].

## 2018-11-10 ENCOUNTER — Telehealth: Payer: Self-pay | Admitting: *Deleted

## 2018-11-10 NOTE — Telephone Encounter (Signed)
Left patient a message to call in reference to a referral put in for her by her PCP.

## 2018-12-13 ENCOUNTER — Encounter: Payer: Medicaid Other | Admitting: Family Medicine

## 2018-12-15 ENCOUNTER — Encounter: Payer: Self-pay | Admitting: Gastroenterology

## 2018-12-15 ENCOUNTER — Ambulatory Visit (INDEPENDENT_AMBULATORY_CARE_PROVIDER_SITE_OTHER): Payer: Self-pay | Admitting: Family Medicine

## 2018-12-15 DIAGNOSIS — F332 Major depressive disorder, recurrent severe without psychotic features: Secondary | ICD-10-CM

## 2018-12-15 DIAGNOSIS — R197 Diarrhea, unspecified: Secondary | ICD-10-CM

## 2018-12-15 DIAGNOSIS — Z0000 Encounter for general adult medical examination without abnormal findings: Secondary | ICD-10-CM

## 2018-12-15 DIAGNOSIS — I10 Essential (primary) hypertension: Secondary | ICD-10-CM

## 2018-12-15 DIAGNOSIS — F411 Generalized anxiety disorder: Secondary | ICD-10-CM

## 2018-12-15 DIAGNOSIS — Z0001 Encounter for general adult medical examination with abnormal findings: Secondary | ICD-10-CM

## 2018-12-15 DIAGNOSIS — F5101 Primary insomnia: Secondary | ICD-10-CM

## 2018-12-15 DIAGNOSIS — K580 Irritable bowel syndrome with diarrhea: Secondary | ICD-10-CM

## 2018-12-15 MED ORDER — CLONAZEPAM 0.5 MG PO TABS: 0.5000 mg | ORAL_TABLET | Freq: Two times a day (BID) | ORAL | 1 refills | Status: AC | PRN

## 2018-12-15 MED ORDER — AMITRIPTYLINE HCL 50 MG PO TABS: 50.0000 mg | ORAL_TABLET | Freq: Every day | ORAL | 3 refills | Status: DC

## 2018-12-15 NOTE — Progress Notes (Signed)
Virtual Visit  via Video Note  I connected with      Campbell C Hinson by a video enabled telemedicine application and verified that I am speaking with the correct person using two identifiers.   I discussed the limitations of evaluation and management by telemedicine and the availability of in person appointments. The patient expressed understanding and agreed to proceed.  History of Present Illness: Lindsay Soto is a 38 y.o. female present for well adult visit.  Kytzia has a variety of medical problems.  She also has significant morbid obesity.  She is gotten some of her health maintenance items completed in the past however her general lack of health insurance is been a real struggle.  She is not eating carefully nor exercising regularly.  She has a history of hypertension which has not been very well controlled.  She previously was taking lisinopril hydrochlorothiazide and amlodipine.  She intends to become pregnant and therefore lisinopril and amlodipine were  discontinued.  However she still is taking lisinopril in addition to the hydrochlorothiazide.  She does not have a home blood pressure cuff.  Additionally she notes that she is had significant trouble sleeping.  She is been taking amitriptyline trazodone and gabapentin.  These are left over medications or medications that her husband takes.  She notes despite this significant regimen she still has trouble staying asleep.  She notes that she feels very fatigued in the morning.  Additionally she notes significant anxiety.  She already takes Celexa and has lots of stressors.  She is had trials of counseling in the past.  She is interested in potentially trying some other medications to help control her anxiety a bit better.  She also notes worsening chronic back pain.  She is tried naproxen which does not help very much.   She has a history of amenorrhea or severe irregular menstrual periods.  She was prescribed intermittent Provera every 14  days which has made a big difference in hours having regular cycles.  Patient also notes that she continues to have frequent diarrhea episodes.  She is had trial of her vigorously which did not help.  Observations/Objective: There were no vitals taken for this visit. Wt Readings from Last 5 Encounters:  10/21/18 (!) 386 lb (175.1 kg)  05/19/18 (!) 365 lb (165.6 kg)  08/05/17 (!) 357 lb (161.9 kg)  06/08/17 (!) 371 lb (168.3 kg)  02/04/17 (!) 364 lb (165.1 kg)   Exam: Appearance Normal Speech.   Depression screen [facility] 2/9 12/15/2018 05/19/2018 08/05/2017 02/04/2017 12/19/2016  Decreased Interest 3 3 3 2 2   Down, Depressed, Hopeless 3 3 3 3 3   PHQ - 2 Score 6 6 6 5 5   Altered sleeping 3 3 3 3 2   Tired, decreased energy 3 3 3 3 3   Change in appetite 3 3 0 3 2  Feeling bad or failure about yourself  3 2 2 3  0  Trouble concentrating 0 0 0 0 0  Moving slowly or fidgety/restless 3 3 0 0 3  Suicidal thoughts 3 3 3 3 3   PHQ-9 Score 24 23 17 20 18   Difficult doing work/chores Extremely dIfficult Very difficult Somewhat difficult Somewhat difficult -  Some recent data might be hidden   GAD 7 : Generalized Anxiety Score 12/15/2018 05/19/2018 08/05/2017 02/04/2017  Nervous, Anxious, on Edge 2 3 3 3   Control/stop worrying 3 3 3 3   Worry too much - different things 3 3 3 3   Trouble relaxing  3 3 3 3   Restless 3 3 3  0  Easily annoyed or irritable 3 3 3 3   Afraid - awful might happen 3 3 3 3   Total GAD 7 Score 20 21 21 18   Anxiety Difficulty Extremely difficult Extremely difficult Very difficult Somewhat difficult     Lab and Radiology Results No results found for this or any previous visit (from the past 84 hour(s)). No results found.   Assessment and Plan: 38 y.o. female with  Well adult visit.  Patient has multiple severe medical problems.  Fundamentally she has either no health insurance or inadequate health insurance intermittently with Medicaid.  This is severely limited my ability to get  her the screening testing care that she needs.  Her main issue is obesity.  This is been a constant struggle.  Ultimately she probably will benefit from a gastric bypass surgery but in the meantime we will continue to work on diet and exercise management.  Back pain will have to be deferred until she can return to clinic in person as it is difficult to evaluate remotely.  Anxiety remains of a severe problem.  Continue Celexa add Klonopin recheck 1 month.  Insomnia: Hopefully Klonopin will help some.  Discontinue trazodone and gabapentin as is or not her medications.  I will prescribe her own amitriptyline for use at bedtime also.  This may have some benefit with pain as well.  Hypertension: We will have to recheck here in near future to reassess blood pressure however his hydrochlorothiazide alone is probably not going to be sufficient to control her blood pressure.  Will refer to gastroenterology for chronic diarrhea not controlled with Viberzi as well as other generic medications.  PDMP not reviewed this encounter. Orders Placed This Encounter  Procedures  . Ambulatory referral to Gastroenterology    Referral Priority:   Routine    Referral Type:   Consultation    Referral Reason:   Specialty Services Required    Number of Visits Requested:   1   Meds ordered this encounter  Medications  . amitriptyline (ELAVIL) 50 MG tablet    Sig: Take 1 tablet (50 mg total) by mouth at bedtime.    Dispense:  90 tablet    Refill:  3  . clonazePAM (KLONOPIN) 0.5 MG tablet    Sig: Take 1 tablet (0.5 mg total) by mouth 2 (two) times daily as needed for anxiety.    Dispense:  60 tablet    Refill:  1    Follow Up Instructions:    I discussed the assessment and treatment plan with the patient. The patient was provided an opportunity to ask questions and all were answered. The patient agreed with the plan and demonstrated an understanding of the instructions.   The patient was advised to call  back or seek an in-person evaluation if the symptoms worsen or if the condition fails to improve as anticipated.  Time: 25 minutes of intraservice time, with >39 minutes of total time during today's visit.      Historical information moved to improve visibility of documentation.  Past Medical History:  Diagnosis Date  . Hypertension   . IBS (irritable bowel syndrome) 04/30/2016  . Obese    Past Surgical History:  Procedure Laterality Date  . CHOLECYSTECTOMY     Social History   Tobacco Use  . Smoking status: Current Every Day Smoker  . Smokeless tobacco: Never Used  Substance Use Topics  . Alcohol use: No   family  history includes Depression in her mother; Hyperlipidemia in her mother; Hypertension in her mother.  Medications: Current Outpatient Medications  Medication Sig Dispense Refill  . citalopram (CELEXA) 40 MG tablet Take 1 tablet (40 mg total) by mouth daily. For depression/anxiety 30 tablet 3  . Eluxadoline (VIBERZI) 100 MG TABS Take 1 tablet (100 mg total) by mouth 2 (two) times daily. 180 tablet 3  . hydrochlorothiazide (HYDRODIURIL) 25 MG tablet Take 1 tablet (25 mg total) by mouth daily. For blood pressure 30 tablet 12  . medroxyPROGESTERone (PROVERA) 10 MG tablet 1 pill daily for 14 days each month 14 tablet 12  . ranitidine (ZANTAC) 300 MG tablet Take 1 tablet (300 mg total) by mouth 2 (two) times daily. For acid reflux 180 tablet 3  . amitriptyline (ELAVIL) 50 MG tablet Take 1 tablet (50 mg total) by mouth at bedtime. 90 tablet 3  . clonazePAM (KLONOPIN) 0.5 MG tablet Take 1 tablet (0.5 mg total) by mouth 2 (two) times daily as needed for anxiety. 60 tablet 1   No current facility-administered medications for this visit.    No Known Allergies

## 2018-12-16 DIAGNOSIS — F411 Generalized anxiety disorder: Secondary | ICD-10-CM | POA: Insufficient documentation

## 2019-01-17 ENCOUNTER — Ambulatory Visit: Payer: Medicaid Other | Admitting: Gastroenterology

## 2019-02-28 ENCOUNTER — Ambulatory Visit (INDEPENDENT_AMBULATORY_CARE_PROVIDER_SITE_OTHER): Payer: Self-pay | Admitting: Family Medicine

## 2019-02-28 DIAGNOSIS — F5101 Primary insomnia: Secondary | ICD-10-CM

## 2019-02-28 DIAGNOSIS — G2581 Restless legs syndrome: Secondary | ICD-10-CM | POA: Insufficient documentation

## 2019-02-28 DIAGNOSIS — N3281 Overactive bladder: Secondary | ICD-10-CM

## 2019-02-28 DIAGNOSIS — F332 Major depressive disorder, recurrent severe without psychotic features: Secondary | ICD-10-CM

## 2019-02-28 MED ORDER — ROPINIROLE HCL 0.25 MG PO TABS: 0.2500 mg | ORAL_TABLET | Freq: Every day | ORAL | 3 refills | Status: DC

## 2019-02-28 MED ORDER — AMITRIPTYLINE HCL 50 MG PO TABS: 50.0000 mg | ORAL_TABLET | Freq: Every day | ORAL | 3 refills | Status: AC

## 2019-02-28 MED ORDER — OXYBUTYNIN CHLORIDE 5 MG PO TABS: 5.0000 mg | ORAL_TABLET | Freq: Three times a day (TID) | ORAL | 1 refills | Status: AC

## 2019-02-28 MED ORDER — CITALOPRAM HYDROBROMIDE 40 MG PO TABS: 40.0000 mg | ORAL_TABLET | Freq: Every day | ORAL | 3 refills | Status: AC

## 2019-02-28 NOTE — Progress Notes (Signed)
Virtual Visit  I connected with      Lindsay Soto  by a telemedicine application and verified that I am speaking with the correct person using two identifiers.   I discussed the limitations of evaluation and management by telemedicine and the availability of in person appointments. The patient expressed understanding and agreed to proceed.  History of Present Illness: Lindsay Soto is a 38 y.o. female who would like to discuss leg twitching.  Patient notes bothersome jumpy feeling in her legs at bedtime.  She notes that she recently started working for [employer] and notes after a long shift her legs are especially bothersome.  This interferes with sleep.  She is done some reading and thinks perhaps she has restless leg syndrome.  Additionally she has a history of overactive bladder and bladder spasms.  In the past she was prescribed oxybutynin.  She notes 5 mg was not sufficient in the past she thinks maybe she was prescribed 10.  She is interested in restarting oxybutynin as her urinary symptoms are bothersome.  Additionally she notes that she needs refill of amitriptyline for chronic pain and refill of Celexa for depression.  She notes depressive symptoms are moderately controlled.     Observations/Objective: There were no vitals taken for this visit. Wt Readings from Last 5 Encounters:  10/21/18 (!) 386 lb (175.1 kg)  05/19/18 (!) 365 lb (165.6 kg)  08/05/17 (!) 357 lb (161.9 kg)  06/08/17 (!) 371 lb (168.3 kg)  02/04/17 (!) 364 lb (165.1 kg)   Exam: Normal Speech.  Psych alert and oriented normal speech thought process verbal affect.  Lab and Radiology Results No results found for this or any previous visit (from the past 72 hour(s)). No results found.   Assessment and Plan: 38 y.o. female with  Restless leg syndrome: Patient describes restless leg syndrome.  Plan to use low-dose Requip at bedtime with titration when rechecked in 1 month.  Overactive bladder:  Restart oxybutynin.  Will keep at 5 mg.  Higher doses are probably not going to be very effective and especially with combination of other potentially sedating medicines at bedtime would like to avoid accidental medication interaction.  Insomnia: Refill amitriptyline.  Depression: Refill Celexa.  Recheck in 1 to 2 months.  Discussed need for new PCP.  PDMP not reviewed this encounter. No orders of the defined types were placed in this encounter.  Meds ordered this encounter  Medications  . rOPINIRole (REQUIP) 0.25 MG tablet    Sig: Take 1 tablet (0.25 mg total) by mouth at bedtime.    Dispense:  30 tablet    Refill:  3  . amitriptyline (ELAVIL) 50 MG tablet    Sig: Take 1 tablet (50 mg total) by mouth at bedtime.    Dispense:  90 tablet    Refill:  3  . oxybutynin (DITROPAN) 5 MG tablet    Sig: Take 1 tablet (5 mg total) by mouth 3 (three) times daily.    Dispense:  90 tablet    Refill:  1  . citalopram (CELEXA) 40 MG tablet    Sig: Take 1 tablet (40 mg total) by mouth daily. For depression/anxiety    Dispense:  30 tablet    Refill:  3    Follow Up Instructions:    I discussed the assessment and treatment plan with the patient. The patient was provided an opportunity to ask questions and all were answered. The patient agreed with the plan and  demonstrated an understanding of the instructions.   The patient was advised to call back or seek an in-person evaluation if the symptoms worsen or if the condition fails to improve as anticipated.  Time: 15 minutes of intraservice time, with >22 minutes of total time during today's visit.      Historical information moved to improve visibility of documentation.  Past Medical History:  Diagnosis Date  . Hypertension   . IBS (irritable bowel syndrome) 04/30/2016  . Obese    Past Surgical History:  Procedure Laterality Date  . CHOLECYSTECTOMY     Social History   Tobacco Use  . Smoking status: Current Every Day Smoker  .  Smokeless tobacco: Never Used  Substance Use Topics  . Alcohol use: No   family history includes Depression in her mother; Hyperlipidemia in her mother; Hypertension in her mother.  Medications: Current Outpatient Medications  Medication Sig Dispense Refill  . amitriptyline (ELAVIL) 50 MG tablet Take 1 tablet (50 mg total) by mouth at bedtime. 90 tablet 3  . citalopram (CELEXA) 40 MG tablet Take 1 tablet (40 mg total) by mouth daily. For depression/anxiety 30 tablet 3  . clonazePAM (KLONOPIN) 0.5 MG tablet Take 1 tablet (0.5 mg total) by mouth 2 (two) times daily as needed for anxiety. 60 tablet 1  . Eluxadoline (VIBERZI) 100 MG TABS Take 1 tablet (100 mg total) by mouth 2 (two) times daily. 180 tablet 3  . hydrochlorothiazide (HYDRODIURIL) 25 MG tablet Take 1 tablet (25 mg total) by mouth daily. For blood pressure 30 tablet 12  . medroxyPROGESTERone (PROVERA) 10 MG tablet 1 pill daily for 14 days each month 14 tablet 12  . oxybutynin (DITROPAN) 5 MG tablet Take 1 tablet (5 mg total) by mouth 3 (three) times daily. 90 tablet 1  . ranitidine (ZANTAC) 300 MG tablet Take 1 tablet (300 mg total) by mouth 2 (two) times daily. For acid reflux 180 tablet 3  . rOPINIRole (REQUIP) 0.25 MG tablet Take 1 tablet (0.25 mg total) by mouth at bedtime. 30 tablet 3   No current facility-administered medications for this visit.    No Known Allergies

## 2019-05-11 ENCOUNTER — Ambulatory Visit (INDEPENDENT_AMBULATORY_CARE_PROVIDER_SITE_OTHER): Payer: Medicaid Other

## 2019-05-11 ENCOUNTER — Encounter: Payer: Self-pay | Admitting: Family Medicine

## 2019-05-11 ENCOUNTER — Other Ambulatory Visit: Payer: Self-pay

## 2019-05-11 ENCOUNTER — Ambulatory Visit (INDEPENDENT_AMBULATORY_CARE_PROVIDER_SITE_OTHER): Payer: Self-pay | Admitting: Family Medicine

## 2019-05-11 VITALS — BP 132/88 | HR 71 | Ht 65.0 in | Wt 377.2 lb

## 2019-05-11 DIAGNOSIS — G2581 Restless legs syndrome: Secondary | ICD-10-CM

## 2019-05-11 DIAGNOSIS — G8929 Other chronic pain: Secondary | ICD-10-CM

## 2019-05-11 DIAGNOSIS — M25562 Pain in left knee: Secondary | ICD-10-CM

## 2019-05-11 MED ORDER — ROPINIROLE HCL 0.25 MG PO TABS: 0.2500 mg | ORAL_TABLET | Freq: Every day | ORAL | 3 refills | Status: AC

## 2019-05-11 NOTE — Patient Instructions (Signed)
Thank you for coming in today. I think the pain is caused by some arthritis.  Use tylneol for pain and diclofenac gel or over the counter aspercream.  Next step is not better is injection.  Schedule with me any time for that.

## 2019-05-11 NOTE — Progress Notes (Signed)
I, Lindsay Soto, LAT, ATC, am serving as scribe for Dr. Lynne Leader.  Lindsay Soto is a 39 y.o. female who presents to [place] at [facility] today for L knee pain since September 2020 w/ no known MOI.  She states that her L knee stays stiff a lot especially after prolonged sitting.  Pt rates her L knee pain at an 8/10 that she describes as aching.  She describes radiating pain into her L lower leg.  She denies any mechanical symptoms or numbness/tingling.  Aggravating factors include prolonged standing and walking and prolonged sitting.  She has tried Tylenol w/ some relief.  Additionally patient notes continued restless leg syndrome.  She is due for refill of Requip.  She notes this has been moderately helpful.  I was her previous primary care provider and she is waiting to establish care with new PCP Dr. Luetta Nutting at [facility] next month.  She notes that she needs a refill.  Relevant historical information: Severe obesity.   ROS:  As above  Exam:  BP 132/88 (BP Location: Left Arm, Patient Position: Sitting, Cuff Size: Large)   Pulse 71   Ht 5\' 5"  (1.651 m)   Wt (!) 377 lb 3.2 oz (171.1 kg)   LMP 05/03/2019   SpO2 98%   BMI 62.77 kg/m   MSK: Left knee: Obese knee.  No obvious effusion. Range of motion 0-90 degrees with mild crepitation. Moderate tender palpation medial and lateral joint lines. Stable ligaments exam.  Negative McMurray's test however some guarding with exam testing present.     Lab and Radiology Results X-ray images left knee obtained today personally and independently reviewed Moderate medial compartment DJD.  No acute fractures.  No other severe changes. Await formal radiology review    Assessment and Plan: 39 y.o. female with left knee pain.  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  Her morbid obesity is obviously a contributing factor to her pain as well.  Discussed treatment plan and options.  Plan  for quad strengthening weight loss and Voltaren gel.  Additionally use oral Tylenol.  Offered steroid injection patient declined will think about it and return if needed.  Additionally temporarily refilled Requip.  Patient is in between primary care providers as I was her previous primary care provider.  She will establish care and follow-up with new PCP Dr. Luetta Nutting next month.   PDMP not reviewed this encounter. Orders Placed This Encounter  Procedures  . DG Knee 4 Views W/Patella Left    Standing Status:   Future    Number of Occurrences:   1    Standing Expiration Date:   07/08/2020    Order Specific Question:   Reason for Exam (SYMPTOM  OR DIAGNOSIS REQUIRED)    Answer:   left knee pain x 4 months    Order Specific Question:   Is patient pregnant?    Answer:   No    Order Specific Question:   Preferred imaging location?    Answer:   [facility]    Order Specific Question:   Radiology Contrast Protocol - do NOT remove file path    Answer:   \\[email or web address]   Meds ordered this encounter  Medications  . rOPINIRole (REQUIP) 0.25 MG tablet    Sig: Take 1 tablet (0.25 mg total) by mouth at bedtime.    Dispense:  30 tablet    Refill:  3     Discussed  warning signs or symptoms. Please see discharge instructions. Patient expresses understanding.  The above documentation has been reviewed and is accurate and complete Lynne Leader

## 2019-05-12 NOTE — Progress Notes (Signed)
X-ray knee shows arthritis like we discussed.

## 2019-07-29 ENCOUNTER — Ambulatory Visit (INDEPENDENT_AMBULATORY_CARE_PROVIDER_SITE_OTHER): Payer: Self-pay | Admitting: Family Medicine

## 2019-07-29 ENCOUNTER — Other Ambulatory Visit: Payer: Self-pay

## 2019-07-29 ENCOUNTER — Ambulatory Visit (INDEPENDENT_AMBULATORY_CARE_PROVIDER_SITE_OTHER): Payer: Medicaid Other

## 2019-07-29 VITALS — BP 110/72 | HR 65 | Ht 65.0 in | Wt 371.0 lb

## 2019-07-29 DIAGNOSIS — M25561 Pain in right knee: Secondary | ICD-10-CM

## 2019-07-29 DIAGNOSIS — M5441 Lumbago with sciatica, right side: Secondary | ICD-10-CM

## 2019-07-29 DIAGNOSIS — G5601 Carpal tunnel syndrome, right upper limb: Secondary | ICD-10-CM

## 2019-07-29 MED ORDER — CYCLOBENZAPRINE HCL 10 MG PO TABS: 10.0000 mg | ORAL_TABLET | Freq: Three times a day (TID) | ORAL | 0 refills | Status: AC | PRN

## 2019-07-29 MED ORDER — PREDNISONE 50 MG PO TABS: 50.0000 mg | ORAL_TABLET | Freq: Every day | ORAL | 0 refills | Status: DC

## 2019-07-29 NOTE — Progress Notes (Signed)
I, Wendy Poet, LAT, ATC, am serving as scribe for Dr. Lynne Leader.  Lindsay Soto is a 39 y.o. female who presents to [place] at [facility] today for right low back and leg pain.  Patient was last seen May 11, 2019.   Left knee pain: Thought to be DJD complicated by morbid obesity.  Treated with quad strengthening weight loss recommendation Voltaren gel.  Offered steroid injection and patient declined at that point. Patient states that her pain is increasing. L>R. Pain is now radiating down into her feet. Pain is throughout entire joint bilaterally. Using Tylenol for pain.  Left knee pain has improved a bit.  Patient is also complaining of pain in thoracic and lumbar spine. 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 Every day last week patient had muscle spasms in between scapula.   Additionally she notes pain in the right leg.  Pain occurs predominantly at the right anterior knee and radiates to the right lateral calf.  This is worse with activity and better with rest.  She notes some numbness and tingling in this area as well.  She denies injury.  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  Additionally she notes some numbness and tingling in abnormal sensation to the first 3 digits of her right hand.  This has been ongoing for about a month and occurs when she is driving her car and will sometimes wake her from sleep.  She denies any injury or significant weakness or change in grip strength.  She is right-handed.  Pertinent review of systems: No fevers or chills  Relevant historical information: Morbid obesity hypertension.  History clubfoot   Exam:  BP 110/72   Pulse 65   Ht 5\' 5"  (1.651 m)   Wt (!) 371 lb (168.3 kg)   SpO2 98%   BMI 61.74 kg/m  General: Well Developed, well nourished, and in no acute distress.   MSK:  L-spine nontender to midline.  Tender palpation lumbar paraspinal musculature. Decreased lumbar motion. Right  knee normal-appearing nontender normal motion. Antalgic gait mildly.  Right wrist normal-appearing nontender normal motion positive Tinel's overlying carpal tunnel.  Lab and Radiology Results  X-ray images obtained today personally and independently reviewed  Right knee: Moderate DJD medial compartment no acute fractures.  L-spine: Moderate DDD and facet DJD L5-S1 no acute fractures or significant malalignment  Await formal radiology review  Assessment and Plan: 39 y.o. female with several different issues today.  Right hand numbness very likely carpal tunnel syndrome based on physical exam.  Plan for wrist splint provided in clinic today.  Recheck back in about a month.  Back pain and pain radiating down right leg likely lumbosacral strain with probable L5 radiculopathy.  Plan for trial of prednisone.  Advised patient to consider physical therapy.  She is thinking about it if she decides that she would like to do physical therapy she will let me know where she wants to go.  She lives in [place].   PDMP not reviewed this encounter. Orders Placed This Encounter  Procedures  . DG Lumbar Spine 2-3 Views    Standing Status:   Future    Number of Occurrences:   1    Standing Expiration Date:   09/27/2020    Order Specific Question:   Reason for Exam (SYMPTOM  OR DIAGNOSIS REQUIRED)    Answer:   eval back pain and right leg pain  Order Specific Question:   Is patient pregnant?    Answer:   No    Order Specific Question:   Preferred imaging location?    Answer:   [facility]    Order Specific Question:   Radiology Contrast Protocol - do NOT remove file path    Answer:   \\[email or web address]  . DG Knee 4 Views W/Patella Right    Standing Status:   Future    Number of Occurrences:   1    Standing Expiration Date:   09/27/2020    Order Specific Question:   Reason for Exam (SYMPTOM  OR DIAGNOSIS REQUIRED)    Answer:    eval right knee pain    Order Specific Question:   Is patient pregnant?    Answer:   No    Order Specific Question:   Preferred imaging location?    Answer:   [facility]    Order Specific Question:   Radiology Contrast Protocol - do NOT remove file path    Answer:   \\[email or web address]   Meds ordered this encounter  Medications  . predniSONE (DELTASONE) 50 MG tablet    Sig: Take 1 tablet (50 mg total) by mouth daily.    Dispense:  5 tablet    Refill:  0  . cyclobenzaprine (FLEXERIL) 10 MG tablet    Sig: Take 1 tablet (10 mg total) by mouth 3 (three) times daily as needed for muscle spasms.    Dispense:  30 tablet    Refill:  0     Discussed warning signs or symptoms. Please see discharge instructions. Patient expresses understanding.   The above documentation has been reviewed and is accurate and complete Lynne Leader

## 2019-07-29 NOTE — Progress Notes (Signed)
X-ray right knee shows mild knee arthritis.

## 2019-07-29 NOTE — Progress Notes (Signed)
X-ray L-spine shows mild back arthritis.

## 2019-07-29 NOTE — Patient Instructions (Addendum)
Thank you for coming in today. Take prednisone.  Get xray.  Use muscle relaxer mostly at bedtime.  Recheck with Dannie in about 1 month.   Use the wrist brace at night on the right wrist.

## 2019-08-26 ENCOUNTER — Ambulatory Visit: Payer: Medicaid Other | Admitting: Family Medicine

## 2019-09-02 ENCOUNTER — Ambulatory Visit: Payer: Self-pay

## 2019-09-02 ENCOUNTER — Encounter: Payer: Self-pay | Admitting: Family Medicine

## 2019-09-02 ENCOUNTER — Ambulatory Visit (INDEPENDENT_AMBULATORY_CARE_PROVIDER_SITE_OTHER): Payer: Medicaid Other | Admitting: Family Medicine

## 2019-09-02 VITALS — BP 128/86 | HR 66 | Ht 65.0 in | Wt 363.6 lb

## 2019-09-02 DIAGNOSIS — G5600 Carpal tunnel syndrome, unspecified upper limb: Secondary | ICD-10-CM | POA: Insufficient documentation

## 2019-09-02 DIAGNOSIS — G5601 Carpal tunnel syndrome, right upper limb: Secondary | ICD-10-CM

## 2019-09-02 DIAGNOSIS — M25561 Pain in right knee: Secondary | ICD-10-CM

## 2019-09-02 DIAGNOSIS — G8929 Other chronic pain: Secondary | ICD-10-CM

## 2019-09-02 DIAGNOSIS — M654 Radial styloid tenosynovitis [de Quervain]: Secondary | ICD-10-CM | POA: Insufficient documentation

## 2019-09-02 MED ORDER — CELECOXIB 200 MG PO CAPS
ORAL_CAPSULE | ORAL | 2 refills | Status: AC
Start: 2019-09-02 — End: ?

## 2019-09-02 MED FILL — CELECOXIB 200 MG CAP: 200 | 30 days supply | Qty: 60 | Fill #0

## 2019-09-02 NOTE — Progress Notes (Signed)
Note duplication error 

## 2019-09-02 NOTE — Patient Instructions (Addendum)
Thank you for coming in today. Try celebrex up to 2x daily for knee pain.  For the hand use the brace at night.  Use a thumb spica splint or brace.   Recheck as needed.    Carpal Tunnel Syndrome  Carpal tunnel syndrome is a condition that causes pain in your hand and arm. The carpal tunnel is a narrow area that is on the palm side of your wrist. Repeated wrist motion or certain diseases may cause swelling in the tunnel. This swelling can pinch the main nerve in the wrist (median nerve). What are the causes? This condition may be caused by:  Repeated wrist motions.  Wrist injuries.  Arthritis.  A sac of fluid (cyst) or abnormal growth (tumor) in the carpal tunnel.  Fluid buildup during pregnancy. Sometimes the cause is not known. What increases the risk? The following factors may make you more likely to develop this condition:  Having a job in which you move your wrist in the same way many times. This includes jobs like being a Software engineer or a Scientist, water quality.  Being a woman.  Having other health conditions, such as: ? Diabetes. ? Obesity. ? A thyroid gland that is not active enough (hypothyroidism). ? Kidney failure. What are the signs or symptoms? Symptoms of this condition include:  A tingling feeling in your fingers.  Tingling or a loss of feeling (numbness) in your hand.  Pain in your entire arm. This pain may get worse when you bend your wrist and elbow for a long time.  Pain in your wrist that goes up your arm to your shoulder.  Pain that goes down into your palm or fingers.  A weak feeling in your hands. You may find it hard to grab and hold items. You may feel worse at night. How is this diagnosed? This condition is diagnosed with a medical history and physical exam. You may also have tests, such as:  Electromyogram (EMG). This test checks the signals that the nerves send to the muscles.  Nerve conduction study. This test checks how well signals pass through your  nerves.  Imaging tests, such as X-rays, ultrasound, and MRI. These tests check for what might be the cause of your condition. How is this treated? This condition may be treated with:  Lifestyle changes. You will be asked to stop or change the activity that caused your problem.  Doing exercise and activities that make bones and muscles stronger (physical therapy).  Learning how to use your hand again (occupational therapy).  Medicines for pain and swelling (inflammation). You may have injections in your wrist.  A wrist splint.  Surgery. Follow these instructions at home: If you have a splint:  Wear the splint as told by your doctor. Remove it only as told by your doctor.  Loosen the splint if your fingers: ? Tingle. ? Lose feeling (become numb). ? Turn cold and blue.  Keep the splint clean.  If the splint is not waterproof: ? Do not let it get wet. ? Cover it with a watertight covering when you take a bath or a shower. Managing pain, stiffness, and swelling   If told, put ice on the painful area: ? If you have a removable splint, remove it as told by your doctor. ? Put ice in a plastic bag. ? Place a towel between your skin and the bag. ? Leave the ice on for 20 minutes, 2-3 times per day. General instructions  Take over-the-counter and prescription medicines only as  told by your doctor.  Rest your wrist from any activity that may cause pain. If needed, talk with your boss at work about changes that can help your wrist heal.  Do any exercises as told by your doctor, physical therapist, or occupational therapist.  Keep all follow-up visits as told by your doctor. This is important. Contact a doctor if:  You have new symptoms.  Medicine does not help your pain.  Your symptoms get worse. Get help right away if:  You have very bad numbness or tingling in your wrist or hand. Summary  Carpal tunnel syndrome is a condition that causes pain in your hand and  arm.  It is often caused by repeated wrist motions.  Lifestyle changes and medicines are used to treat this problem. Surgery may help in very bad cases.  Follow your doctor's instructions about wearing a splint, resting your wrist, keeping follow-up visits, and calling for help. This information is not intended to replace advice given to you by your health care provider. Make sure you discuss any questions you have with your health care provider. Document Revised: 08/28/2017 Document Reviewed: 08/28/2017 Elsevier Patient Education  [place] Tenosynovitis  De Quervain's tenosynovitis is a condition that causes inflammation of the tendon on the thumb side of the wrist. Tendons are cords of tissue that connect bones to muscles. The tendons in the hand pass through a tunnel called a sheath. A slippery layer of tissue (synovium) lets the tendons move smoothly in the sheath. With de Quervain's tenosynovitis, the sheath swells or thickens, causing friction and pain. The condition is also called de Quervain's disease and de Quervain's syndrome. It occurs most often in women who are 40-25 years old. What are the causes? The exact cause of this condition is not known. It may be associated with overuse of the hand and wrist. What increases the risk? You are more likely to develop this condition if you:  Use your hands far more than normal, especially if you repeat certain movements that involve twisting your hand or using a tight grip.  Are pregnant.  Are a middle-aged woman.  Have rheumatoid arthritis.  Have diabetes. What are the signs or symptoms? The main symptom of this condition is pain on the thumb side of the wrist. The pain may get worse when you grasp something or turn your wrist. Other symptoms may include:  Pain that extends up the forearm.  Swelling of your wrist and hand.  Trouble moving the thumb and wrist.  A sensation of snapping in the  wrist.  A bump filled with fluid (cyst) in the area of the pain. How is this diagnosed? This condition may be diagnosed based on:  Your symptoms and medical history.  A physical exam. During the exam, your health care provider may do a simple test Wynn Maudlin test) that involves pulling your thumb and wrist to see if this causes pain. You may also need to have an X-ray. How is this treated? Treatment for this condition may include:  Avoiding any activity that causes pain and swelling.  Taking medicines. Anti-inflammatory medicines and corticosteroid injections may be used to reduce inflammation and relieve pain.  Wearing a splint.  Having surgery. This may be needed if other treatments do not work. Once the pain and swelling has gone down:  Physical therapy. This includes stretching and strengthening exercises.  Occupational therapy. This includes adjusting how you move your wrist. Follow these instructions at home: If  you have a splint:  Wear the splint as told by your health care provider. Remove it only as told by your health care provider.  Loosen the splint if your fingers tingle, become numb, or turn cold and blue.  Keep the splint clean.  If the splint is not waterproof: ? Do not let it get wet. ? Cover it with a watertight covering when you take a bath or a shower. Managing pain, stiffness, and swelling   Avoid movements and activities that cause pain and swelling in the wrist area.  If directed, put ice on the painful area. This may be helpful after doing activities that involve the sore wrist. ? Put ice in a plastic bag. ? Place a towel between your skin and the bag. ? Leave the ice on for 20 minutes, 2-3 times a day.  Move your fingers often to avoid stiffness and to lessen swelling.  Raise (elevate) the injured area above the level of your heart while you are sitting or lying down. General instructions  Return to your normal activities as told by your  health care provider. Ask your health care provider what activities are safe for you.  Take over-the-counter and prescription medicines only as told by your health care provider.  Keep all follow-up visits as told by your health care provider. This is important. Contact a health care provider if:  Your pain medicine does not help.  Your pain gets worse.  You develop new symptoms. Summary  De Quervain's tenosynovitis is a condition that causes inflammation of the tendon on the thumb side of the wrist.  The condition occurs most often in women who are 79-49 years old.  The exact cause of this condition is not known. It may be associated with overuse of the hand and wrist.  Treatment starts with avoiding activity that causes pain or swelling in the wrist area. Other treatment may include wearing a splint and taking medicine. Sometimes, surgery is needed. This information is not intended to replace advice given to you by your health care provider. Make sure you discuss any questions you have with your health care provider. Document Revised: 10/22/2017 Document Reviewed: 03/30/2017 Elsevier Patient Education  2020 [employer].

## 2019-09-02 NOTE — Progress Notes (Signed)
I, Lindsay Soto, LAT, ATC, am serving as scribe for Dr. Lynne Leader.  Lindsay Soto is a 39 y.o. female who presents to [place] at [facility] today for f/u of low back pain and B leg pain radiating to her B feet.  She was last seen by Dr. Georgina Snell on 07/29/19 and was prescribed prednisone and Flexeril.  Since her last visit, pt reports that her low back and her R knee con't to bother her.  She reports intermittent locking in her r knee and is having difficulty transitioning to stand after sitting too long.  She also has increased R knee pain w/ prolonged standing.  She notes right hand numbness and tingling to her first 3 digits.  This is been ongoing for few months.  She works in [employer].  She has been using a night splint which helps a little.  She also is having some soreness and pain at the radial styloid with hand and wrist motion.  She is not had treatment for this yet.   Pertinent review of systems: No fevers or chills  Relevant historical information: Obesity, IBS   Exam:  BP 128/86 (BP Location: Left Arm, Patient Position: Sitting, Cuff Size: Large)   Pulse 66   Ht 5\' 5"  (1.651 m)   Wt (!) 363 lb 9.6 oz (164.9 kg)   SpO2 98%   BMI 60.51 kg/m  General: Well Developed, well nourished, and in no acute distress.   MSK:  Right hand and wrist normal-appearing Tender palpation radial styloid. Normal hand and wrist motion and strength. Positive Finkelstein's test Positive Tinel's carpal tunnel. Positive Phalen's test.    Lab and Radiology Results  Procedure: Real-time Ultrasound Guided right median nerve hydrodissection and carpal tunnel Device: Philips Affiniti 50G Images permanently stored and available for review in the ultrasound unit. Verbal informed consent obtained.  Discussed risks and benefits of procedure. Warned about infection bleeding damage to structures skin hypopigmentation and fat atrophy among others. Patient expresses  understanding and agreement Time-out conducted.   Noted no overlying erythema, induration, or other signs of local infection.   Skin prepped in a sterile fashion.   Local anesthesia: Topical Ethyl chloride.   With sterile technique and under real time ultrasound guidance:  40 mg of Kenalog and 1 mL lidocaine injected easily.   Completed without difficulty   Pain immediately resolved suggesting accurate placement of the medication.   Advised to call if fevers/chills, erythema, induration, drainage, or persistent bleeding.   Images permanently stored and available for review in the ultrasound unit.  Impression: Technically successful ultrasound guided injection.    DG Lumbar Spine 2-3 Views  Result Date: 07/29/2019 CLINICAL DATA:  Back pain and right leg pain. M54.41, M 25.561 EXAM: LUMBAR SPINE - 2-3 VIEW COMPARISON:  None. FINDINGS: Five typical lumbar segments. Alignment is normal. Minimal disc space narrowing at L3-4. No fractures or bone destruction. The sacroiliac joints appear normal. 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 Electronically Signed   By: Lorriane Shire M.D.   On: 07/29/2019 13:07   DG Knee 4 Views W/Patella Right  Result Date: 07/29/2019 CLINICAL DATA:  Right knee pain for 2 weeks. EXAM: RIGHT KNEE - COMPLETE 4+ VIEW COMPARISON:  None FINDINGS: There is slight medial joint space narrowing. Tiny marginal osteophytes on the patella and in the lateral compartment. No fracture or bone destruction or joint effusion. IMPRESSION: Minimal arthritic changes of the right knee. Electronically Signed   By: Jeneen Rinks  Maxwell M.D.   On: 07/29/2019 13:09   I, Lynne Leader, personally (independently) visualized and performed the interpretation of the images attached in this note.      Assessment and Plan: 39 y.o. female with   Right wrist symptoms: Patient has carpal tunnel syndrome as well as de Quervain's tenosynovitis.  She is failing initial conservative management.   Plan for median nerve hydrodissection and transition to thumb spica splint.  Discussed offloading activities using left hand at work if possible. Recheck and 1 to 2 months as needed.  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  Possible that she has a meniscus injury with some of her mechanical symptoms.  Regardless she is not a great critical candidate due to her morbid obesity and also lack of health insurance.  Discussed options.  She would like to avoid injection if possible.  Trial of Cymbalta and recheck as needed.  Next step would probably be injection.  Lack of health insurance: Provided information to apply for contour to program.  I expect that she will be eligible for this.  PDMP not reviewed this encounter. Orders Placed This Encounter  Procedures  . [place] LIMITED JOINT SPACE STRUCTURES LOW RIGHT(NO LINKED CHARGES)    Order Specific Question:   Reason for Exam (SYMPTOM  OR DIAGNOSIS REQUIRED)    Answer:   R knee pain    Order Specific Question:   Preferred imaging location?    Answer:   [facility]   Meds ordered this encounter  Medications  . celecoxib (CELEBREX) 200 MG capsule    Sig: One to 2 tablets by mouth daily as needed for pain.    Dispense:  60 capsule    Refill:  2     Discussed warning signs or symptoms. Please see discharge instructions. Patient expresses understanding.   The above documentation has been reviewed and is accurate and complete Lynne Leader

## 2019-11-17 ENCOUNTER — Encounter: Payer: Self-pay | Admitting: Family Medicine

## 2020-06-22 ENCOUNTER — Encounter: Payer: Self-pay | Admitting: Family Medicine

## 2020-12-11 ENCOUNTER — Encounter: Payer: Self-pay | Admitting: Family Medicine

## 2020-12-11 ENCOUNTER — Telehealth: Payer: Self-pay | Admitting: Family Medicine

## 2020-12-11 NOTE — Progress Notes (Signed)
[facility] Date: September 7th File # [phone]

## 2020-12-11 NOTE — Telephone Encounter (Signed)
Mailed 12/11/2020

## 2020-12-11 NOTE — Telephone Encounter (Signed)
Patient called stating that she is needing a letter from Dr Georgina Snell to excuse her from jury duty. She said that she is not able to attend due to trouble sitting for long periods of time. (Must be turned in by August 31st).   [facility] Date: September 7th File # [id number]  She asked that this be mailed to her home.

## 2020-12-11 NOTE — Telephone Encounter (Signed)
Letter will be sent.

## 2021-01-21 NOTE — Progress Notes (Deleted)
   I, Peterson Lombard, LAT, ATC acting as a scribe for Lynne Leader, MD.  Dulcinea LINDALEE BOATMAN is a 40 y.o. female who presents to [place] at [facility] today for  L thumb pain. Pt was last seen by Dr. Georgina Snell on 09/02/19 for R carpal tunnel and Rde Quervain's tenosynovitis and was given a R median nerve hydrodissection steroid injection and was advised to transition to a thumb spica splint. Today, pt c/o L thumb pain x /. Pt locates pain to   Grip strength: Numbness/tingling: Aggravates: Treatments tried:  Pertinent review of systems: ***  Relevant historical information: ***   Exam:  There were no vitals taken for this visit. General: Well Developed, well nourished, and in no acute distress.   MSK: ***    Lab and Radiology Results No results found for this or any previous visit (from the past 72 hour(s)). No results found.     Assessment and Plan: 40 y.o. female with ***   PDMP not reviewed this encounter. No orders of the defined types were placed in this encounter.  No orders of the defined types were placed in this encounter.    Discussed warning signs or symptoms. Please see discharge instructions. Patient expresses understanding.   ***

## 2021-01-22 ENCOUNTER — Ambulatory Visit: Payer: Medicaid Other | Admitting: Family Medicine

## 2021-01-22 NOTE — Progress Notes (Deleted)
   I, Wendy Poet, LAT, ATC, am serving as scribe for Dr. Lynne Leader.  Lindsay Soto is a 40 y.o. female who presents to [place] at [facility] today for L thumb pain.  She was last seen by Dr. Georgina Snell on 09/02/19 for R hand paresthesias and for f/u of her LBP and R knee pain.  Today, pt reports L thumb pain since .  She locates her pain to .  Swelling: Numbness/tingling: Aggravating factors: Treatments tried: Celebrex and Flexeril prescribed previously for other issues:    Pertinent review of systems: ***  Relevant historical information: ***   Exam:  There were no vitals taken for this visit. General: Well Developed, well nourished, and in no acute distress.   MSK: ***    Lab and Radiology Results No results found for this or any previous visit (from the past 72 hour(s)). No results found.     Assessment and Plan: 40 y.o. female with ***   PDMP not reviewed this encounter. No orders of the defined types were placed in this encounter.  No orders of the defined types were placed in this encounter.    Discussed warning signs or symptoms. Please see discharge instructions. Patient expresses understanding.   ***

## 2021-01-24 ENCOUNTER — Ambulatory Visit: Payer: Self-pay | Admitting: Family Medicine

## 2021-06-23 IMAGING — DX DG KNEE COMPLETE 4+V*R*
4 series · 4 of 4 positions shown · non-contrast
Comparison: None

CLINICAL DATA: Right knee pain for 2 weeks.

EXAM:
RIGHT KNEE - COMPLETE 4+ VIEW

[knee ap]
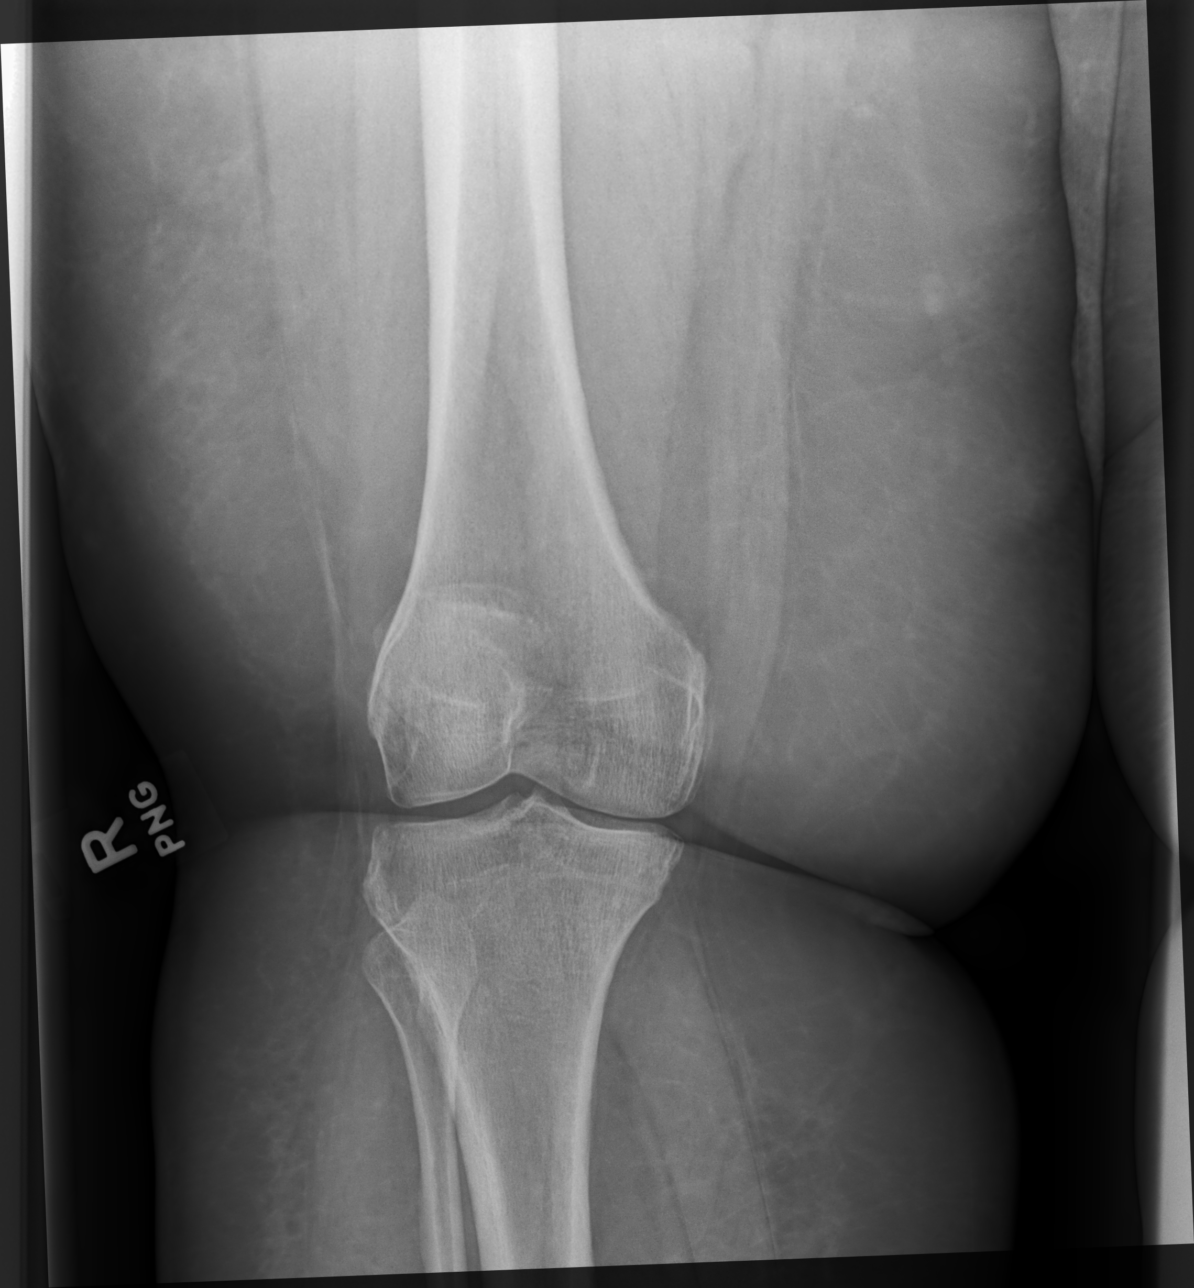

[knee lat]
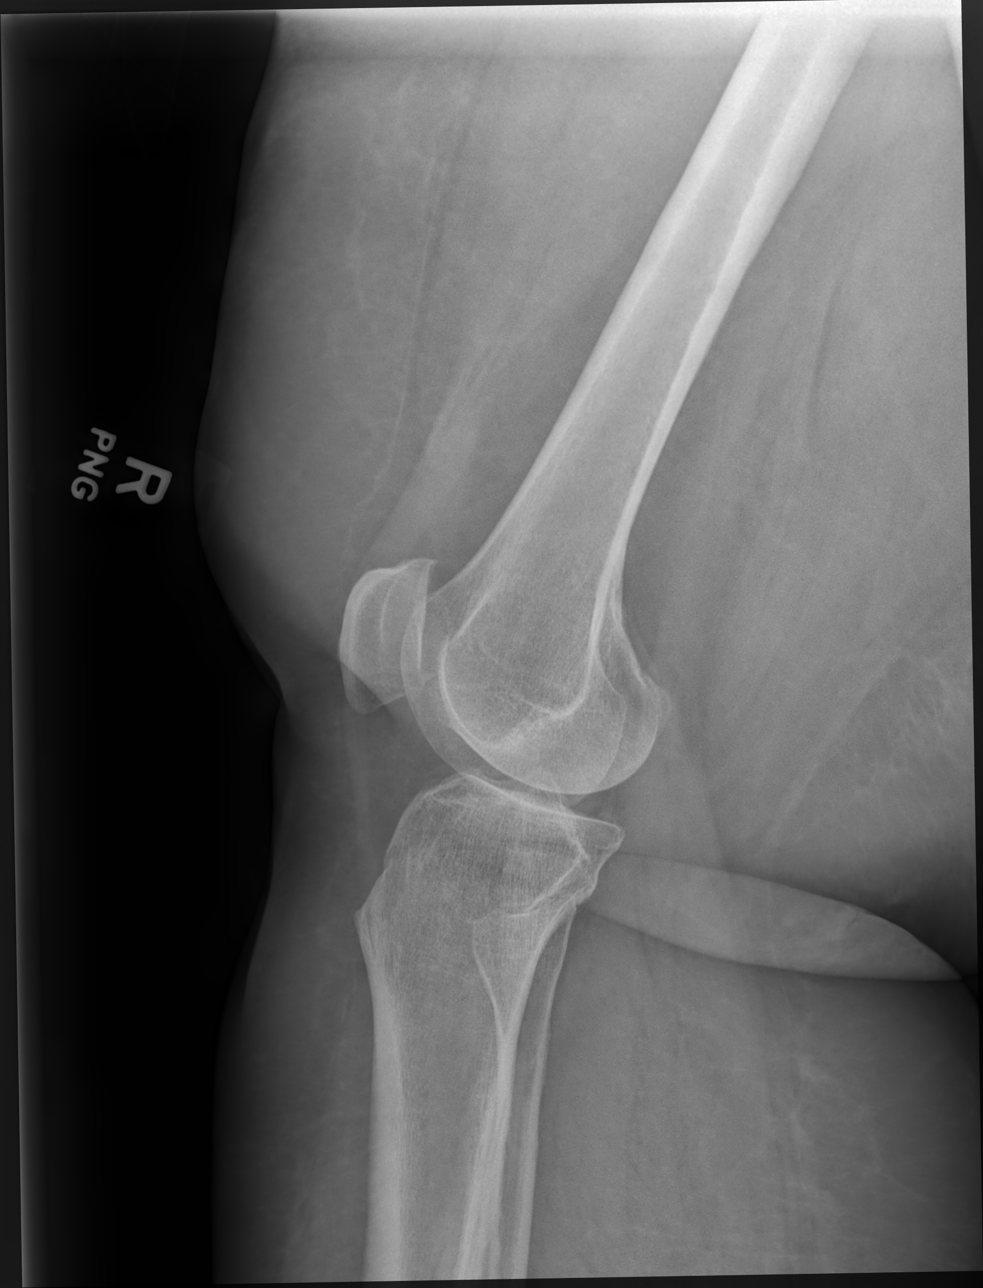

[knee mlo (1 of 2)]
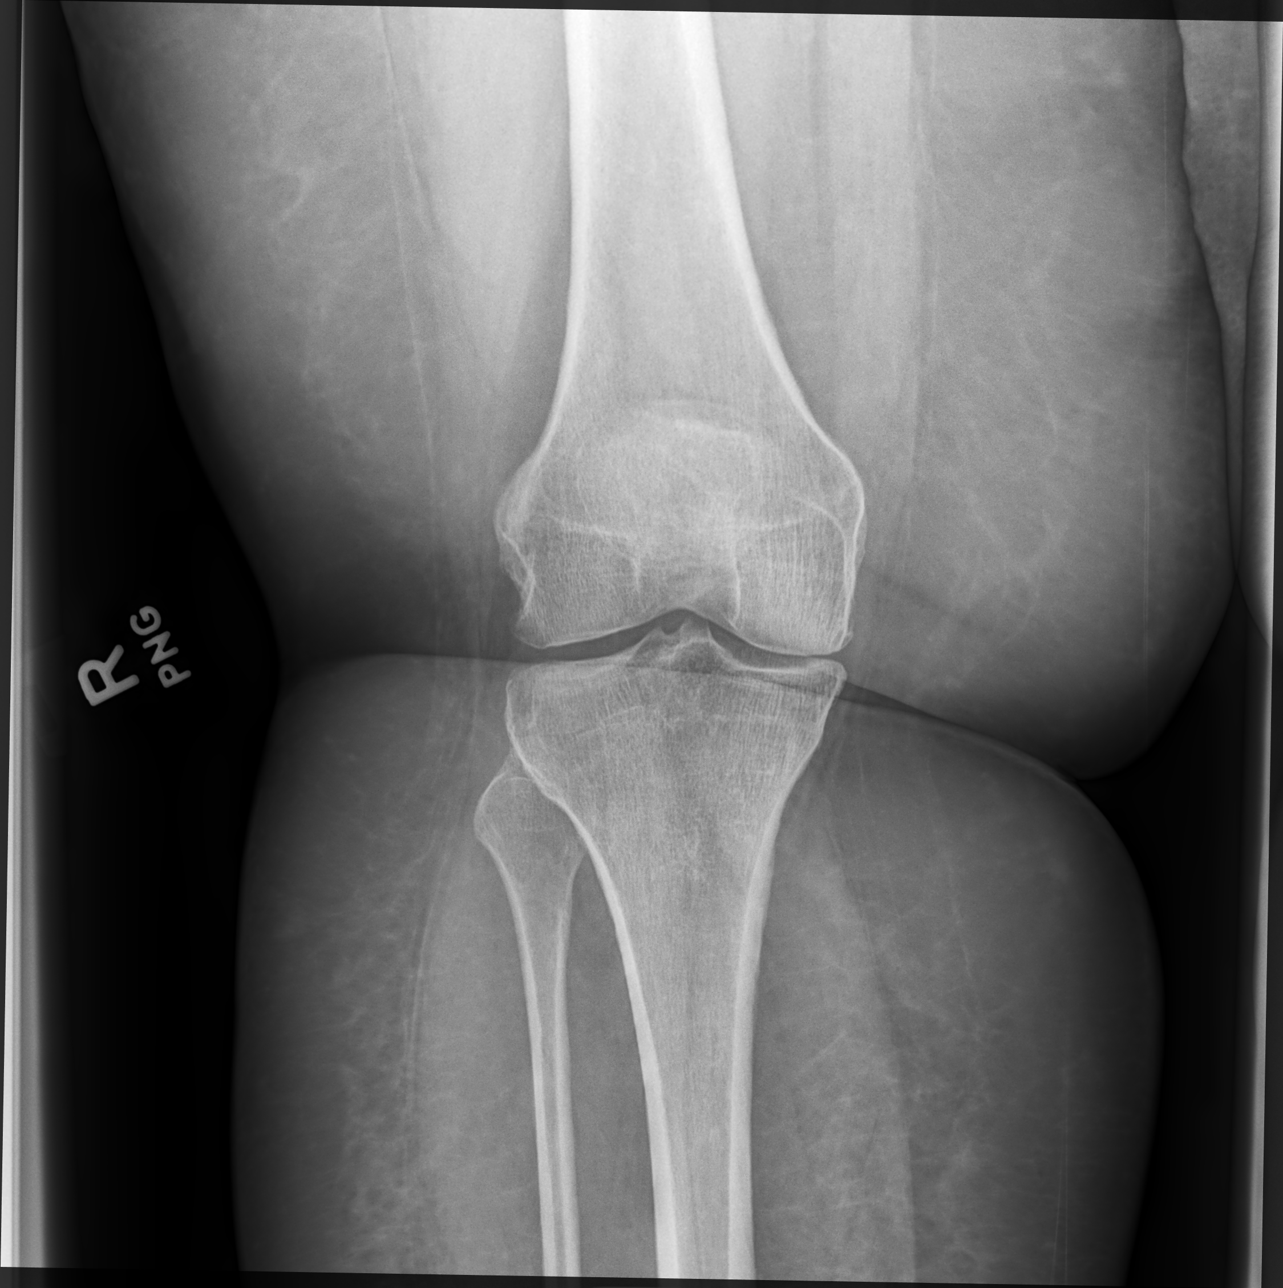

[knee mlo (2 of 2)]
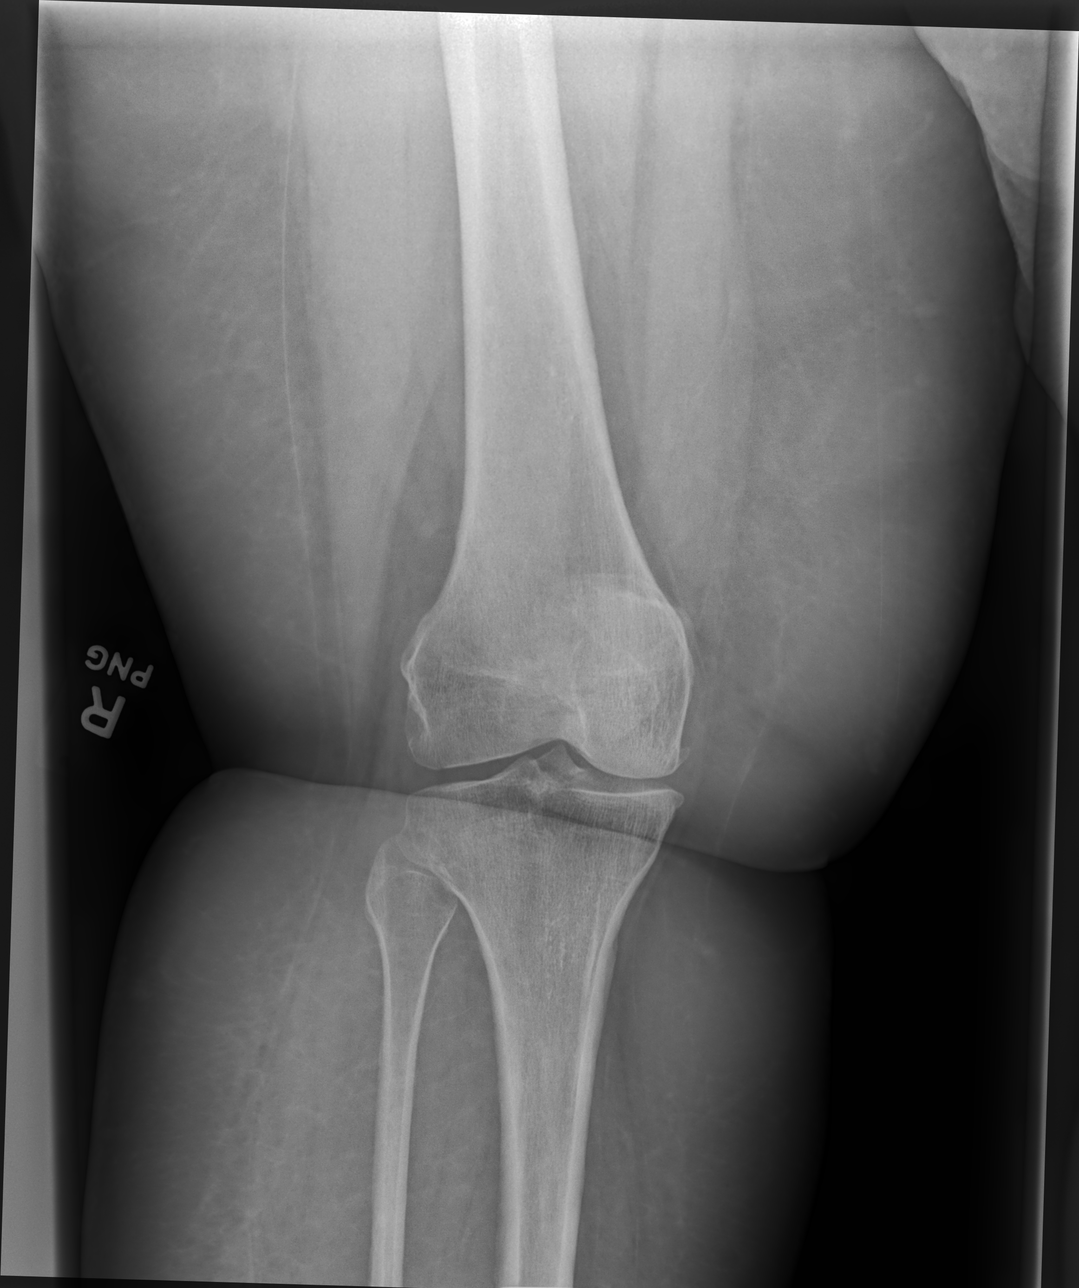

[4 of 4 positions shown; findings below may reference images not displayed]

FINDINGS: There is slight medial joint space narrowing. Tiny marginal
osteophytes on the patella and in the lateral compartment. No
fracture or bone destruction or joint effusion.
IMPRESSION: Minimal arthritic changes of the right knee.

## 2022-03-10 ENCOUNTER — Ambulatory Visit (INDEPENDENT_AMBULATORY_CARE_PROVIDER_SITE_OTHER): Payer: Self-pay | Admitting: Family Medicine

## 2022-03-10 VITALS — BP 182/96 | HR 74 | Ht 65.0 in | Wt >= 6400 oz

## 2022-03-10 DIAGNOSIS — M793 Panniculitis, unspecified: Secondary | ICD-10-CM

## 2022-03-10 DIAGNOSIS — G8929 Other chronic pain: Secondary | ICD-10-CM

## 2022-03-10 DIAGNOSIS — M545 Low back pain, unspecified: Secondary | ICD-10-CM

## 2022-03-10 DIAGNOSIS — M25561 Pain in right knee: Secondary | ICD-10-CM

## 2022-03-10 MED ORDER — DOXYCYCLINE HYCLATE 100 MG PO TABS: 100.0000 mg | ORAL_TABLET | Freq: Two times a day (BID) | ORAL | 0 refills | Status: AC

## 2022-03-10 MED ORDER — ERYTHROMYCIN 250 MG PO TBEC: 250.0000 mg | DELAYED_RELEASE_TABLET | Freq: Four times a day (QID) | ORAL | 0 refills | Status: DC

## 2022-03-10 MED ORDER — DICLOFENAC SODIUM 1 % EX GEL: 4.0000 g | Freq: Four times a day (QID) | CUTANEOUS | 11 refills | Status: AC

## 2022-03-10 NOTE — Progress Notes (Signed)
I, Peterson Lombard, LAT, ATC acting as a scribe for Lynne Leader, MD.  Lindsay Soto is a 41 y.o. female who presents to [place] at [facility] today for f/u and disability documentation update. Pt was last seen by Dr. Georgina Snell on 09/02/19 for R CTS and chronic R knee pain. Today, pt reports cont'd low back pain and R knee pain. Pt locates pain to the midline of her low back.   Radiating pain: no LE numbness/tingling: yes- anterior aspect of her R lower leg LE weakness: no Aggravates: activity, standing Treatments tried: naproxen  She continues to experience chronic pain in multiple locations including low back and right knee.  She last worked in 2022 at [employer].  Prior to that she was working at another [employer].  She was unable to maintain steady appointment at either location due to missing lots of days at work due to exacerbations of pain.  Its been over a year since she worked last due to her chronic pain.  Additionally today she asked about a rash.  She notes a rash on her abdomen along her breasts and abdominal wall.  She notes this is a papular scaly rash especially along the pannus.  No fevers or chills.  Dx imaging: 07/29/19 R knee & L-spine XR   Pertinent review of systems: No fevers or chills.  Relevant historical information: Prior history of panniculitis.   Exam:  BP (!) 182/96   Pulse 74   Ht '5\' 5"'$  (1.651 m)   Wt (!) 400 lb (181.4 kg)   SpO2 95%   BMI 66.56 kg/m  General: Well Developed, well nourished, and in no acute distress.   MSK: L-spine: Normal appearing Nontender to palpation spinal midline.  Tender palpation paraspinal musculature. Decreased lumbar motion.  Right knee: Normal appearing Decreased knee motion with crepitation.  Tender palpation medial joint line. Stable ligamentous exam. Intact strength.  Skin: Anterior breasts and anterior abdomen and pannus erythematous papules with some crusting consistent with folliculitis  and a panic cheilitis.  Mildly tender to palpation.      Assessment and Plan: 41 y.o. female with  Chronic back and knee pain.  These are chronic issues that have been challenging to treat.  Fundamentally her morbid obesity is contributing to both. She is unable to work because of these chronic pain issues.  I do not foresee these issues improving much in the future for her maintaining the ability to work.  I have written a letter stating this.  I do think she is permanently disabled at this point. I have prescribed some diclofenac gel that she can use and we certainly could try steroid injections or other procedures to help improve her pain but I do not think it is going to provide much lasting benefit.  As for her rash today folliculitis/panniculitis.  Prescribe doxycycline.   PDMP not reviewed this encounter. No orders of the defined types were placed in this encounter.  Meds ordered this encounter  Medications   DISCONTD: Erythromycin 250 MG TBEC    Sig: Take 1 tablet (250 mg total) by mouth every 6 (six) hours for 14 days.    Dispense:  56 tablet    Refill:  0   diclofenac Sodium (VOLTAREN) 1 % GEL    Sig: Apply 4 g topically 4 (four) times daily. To affected joint.    Dispense:  100 g    Refill:  11   doxycycline (VIBRA-TABS) 100 MG tablet  Sig: Take 1 tablet (100 mg total) by mouth 2 (two) times daily for 14 days.    Dispense:  28 tablet    Refill:  0    Do not fill Erythromycin. Fill doxy     Discussed warning signs or symptoms. Please see discharge instructions. Patient expresses understanding.   The above documentation has been reviewed and is accurate and complete Lynne Leader, M.D.

## 2022-03-10 NOTE — Patient Instructions (Signed)
Thank you for coming in today.  Get the doxycycline antibiotic

## 2022-10-16 ENCOUNTER — Telehealth: Payer: Self-pay | Admitting: Family Medicine

## 2022-10-16 DIAGNOSIS — M545 Low back pain, unspecified: Secondary | ICD-10-CM

## 2022-10-16 NOTE — Telephone Encounter (Signed)
I saw Lindsay Soto with her husband today.  Plan for PT for back pain

## 2022-11-18 ENCOUNTER — Telehealth: Payer: Self-pay | Admitting: Family Medicine

## 2022-11-18 NOTE — Telephone Encounter (Signed)
OK to compose requested letter (per Dr. Denyse Amass).   Letter drafted to mail to pt. (Placed at the front desk for mailing).

## 2022-11-18 NOTE — Telephone Encounter (Signed)
Patient called asking if Dr Denyse Amass would be able to write her a letter stating that she is not able to work. She would like this mailed to her home address.  Please advise.

## 2022-11-18 NOTE — Telephone Encounter (Signed)
Mailed 11/18/2022

## 2022-11-27 ENCOUNTER — Ambulatory Visit: Payer: Medicaid Other | Admitting: Family Medicine

## 2022-12-08 ENCOUNTER — Ambulatory Visit: Payer: Medicaid Other | Admitting: Family Medicine

## 2022-12-22 ENCOUNTER — Ambulatory Visit: Payer: Medicaid Other | Admitting: Family Medicine

## 2023-01-19 ENCOUNTER — Ambulatory Visit: Payer: Medicaid Other | Admitting: Family Medicine

## 2023-04-09 ENCOUNTER — Ambulatory Visit: Payer: Medicaid Other | Admitting: Family Medicine

## 2023-05-08 ENCOUNTER — Ambulatory Visit: Payer: Medicaid Other | Admitting: Family Medicine

## 2023-06-10 ENCOUNTER — Ambulatory Visit: Payer: Medicaid Other | Admitting: Family Medicine

## 2023-06-22 ENCOUNTER — Ambulatory Visit: Payer: Medicaid Other | Admitting: Family Medicine

## 2023-06-23 ENCOUNTER — Ambulatory Visit: Payer: Medicaid Other | Admitting: Family Medicine

## 2023-07-07 ENCOUNTER — Ambulatory Visit: Payer: Medicaid Other | Admitting: Family Medicine

## 2023-07-07 ENCOUNTER — Ambulatory Visit (INDEPENDENT_AMBULATORY_CARE_PROVIDER_SITE_OTHER)

## 2023-07-07 ENCOUNTER — Other Ambulatory Visit: Payer: Self-pay

## 2023-07-07 VITALS — BP 142/88 | HR 64 | Ht 65.0 in | Wt >= 6400 oz

## 2023-07-07 DIAGNOSIS — G8929 Other chronic pain: Secondary | ICD-10-CM | POA: Diagnosis not present

## 2023-07-07 DIAGNOSIS — M545 Low back pain, unspecified: Secondary | ICD-10-CM | POA: Diagnosis not present

## 2023-07-07 DIAGNOSIS — M25561 Pain in right knee: Secondary | ICD-10-CM | POA: Diagnosis not present

## 2023-07-07 DIAGNOSIS — L84 Corns and callosities: Secondary | ICD-10-CM | POA: Diagnosis not present

## 2023-07-07 MED ORDER — TIZANIDINE HCL 4 MG PO TABS: 4.0000 mg | ORAL_TABLET | Freq: Four times a day (QID) | ORAL | 1 refills | Status: DC | PRN

## 2023-07-07 NOTE — Patient Instructions (Addendum)
 Thank you for coming in today.   Please get an Xray today before you leave   You received an injection today. Seek immediate medical attention if the joint becomes red, extremely painful, or is oozing fluid.   I've referred you to Physical Therapy.  Let us know if you don't hear from them in one week.   I've referred you to Podiatry.  Let us know if you don't hear from them in one week.   Let me know how this goes

## 2023-07-07 NOTE — Progress Notes (Signed)
 Lindsay Payor, PhD, LAT, ATC acting as a scribe for Lindsay Graham, MD.  Lindsay Soto is a 43 y.o. female who presents to [facility] today for R arm pain and exacerbation of her LBP. Pt was last seen by Dr. Denyse Amass on 03/10/22 and was prescribed diclofenac gel.   Today, pt reports R forearm pain ongoing for awhile. Pt locates pain to the posterior aspect of her R elbow w/ radiating pain through forearm and hand.   Radiates: yes Paresthesia: yes- whole R hand and finger Grip strength: decreased Aggravates: gripping Treatments tried: naproxen  She also c/o R knee pain that's been going on for a long time. Pt c/o LBP is also flared up. Pt locates pain to the midline of the lower back. She also notes L heel pain ongoing for a couple months, her husband clipped off a callus, and ever since it has hurt .   Radiating pain: yes- from R knee to lower leg LE numbness/tingling: yes- into toes, w/ "cramping" LE weakness: yes  Dx imaging: 07/29/19 L-spine XR   Pertinent review of systems: No fevers or chills  Relevant historical information: Hypertension obesity anxiety disorder.   Exam:  BP (!) 142/88   Pulse 64   Ht 5\' 5"  (1.651 m)   Wt (!) 404 lb (183.3 kg)   LMP 06/22/2019   SpO2 95%   BMI 67.23 kg/m  General: Well Developed, well nourished, and in no acute distress.   MSK: Right knee mild joint effusion decreased range of motion.  Intact strength.  L-spine nontender to palpation decreased lumbar motion lower extremity strength is intact.  Left foot: Plantar foot with callus or plantar wart.  Tender to palpation.  Lab and Radiology Results  Procedure: Real-time Ultrasound Guided Injection of right knee joint superior lateral patella space Device: Philips Affiniti 50G/GE Logiq Images permanently stored and available for review in PACS Verbal informed consent obtained.  Discussed risks and benefits of procedure. Warned about infection, bleeding,  hyperglycemia damage to structures among others. Patient expresses understanding and agreement Time-out conducted.   Noted no overlying erythema, induration, or other signs of local infection.   Skin prepped in a sterile fashion.   Local anesthesia: Topical Ethyl chloride.   With sterile technique and under real time ultrasound guidance: 40 mg of Kenalog and 2 mL of Marcaine injected into knee joint. Fluid seen entering the joint capsule.   Completed without difficulty   Pain immediately resolved suggesting accurate placement of the medication.   Advised to call if fevers/chills, erythema, induration, drainage, or persistent bleeding.   Images permanently stored and available for review in the ultrasound unit.  Impression: Technically successful ultrasound guided injection. Spinal needle used  X-ray images lumbar spine and right knee obtained today personally and independently interpreted.  Lumbar spine: No acute fractures.  Mild DJD facet joints and DDD around L4-5 and L5-S1.  Right knee: Moderate medial DJD.  No acute fractures are visible.  Await formal radiology review     Assessment and Plan: 43 y.o. female with chronic low back pain due to exacerbation of degenerative changes and muscle dysfunction.  Plan for physical therapy.  She lives in [place] which is in [place].  Will try to use PT in this location.  Tizanidine prescribed as well.  Chronic right knee pain due to exacerbation of DJD.  Plan for steroid injection and PT.  Left plantar foot pain.  This is a plantar wart or callus that is painful.  She is a high risk for complications if I were to try to perform an excision or any kind of treatment for this.  Refer to podiatry.  Will try to find podiatry near to where she lives using [place] as a backup plan.   PDMP not reviewed this encounter. Orders Placed This Encounter  Procedures   [place] LIMITED JOINT SPACE  STRUCTURES LOW RIGHT(NO LINKED CHARGES)    Reason for Exam (SYMPTOM  OR DIAGNOSIS REQUIRED):   right knee pain    Preferred imaging location?:   [facility] Knee AP/LAT W/[place] Right    Standing Status:   Future    Number of Occurrences:   1    Expiration Date:   08/07/2023    Reason for Exam (SYMPTOM  OR DIAGNOSIS REQUIRED):   right knee pain    Preferred imaging location?:   [facility]    Is patient pregnant?:   No   DG Lumbar Spine 2-3 Views    Standing Status:   Future    Number of Occurrences:   1    Expiration Date:   08/07/2023    Reason for Exam (SYMPTOM  OR DIAGNOSIS REQUIRED):   low back pain    Preferred imaging location?:   [facility]    Is patient pregnant?:   No   Ambulatory referral to Physical Therapy    Referral Priority:   Routine    Referral Type:   Physical Medicine    Referral Reason:   Specialty Services Required    Requested Specialty:   Physical Therapy    Number of Visits Requested:   1   Ambulatory referral to Podiatry    Referral Priority:   Routine    Referral Type:   Consultation    Referral Reason:   Specialty Services Required    Requested Specialty:   Podiatry    Number of Visits Requested:   1   Meds ordered this encounter  Medications   tiZANidine (ZANAFLEX) 4 MG tablet    Sig: Take 1 tablet (4 mg total) by mouth every 6 (six) hours as needed for muscle spasms.    Dispense:  90 tablet    Refill:  1     Discussed warning signs or symptoms. Please see discharge instructions. Patient expresses understanding.   The above documentation has been reviewed and is accurate and complete Lindsay Soto, M.D.

## 2023-07-20 NOTE — Progress Notes (Signed)
Right knee x-ray shows medium arthritis

## 2023-07-20 NOTE — Progress Notes (Signed)
 Lumbar spine x-ray shows some arthritis at the base of the spine.

## 2023-09-24 ENCOUNTER — Telehealth: Payer: Self-pay | Admitting: Family Medicine

## 2023-09-24 NOTE — Telephone Encounter (Signed)
 Letter drafted and placed at the front desk for pick up. I recommend that she review the letter to make sure everything looks OK before leaving the office.

## 2023-09-24 NOTE — Telephone Encounter (Signed)
 Patient called asking if Dr Alease Hunter could write her a letter stating she is not able to work, her past medical history and what was done at her most recent visit so that she can send it to her disability lawyer (similar to what was written on 11/18/2022 but with the more recent visit info). Call back # [phone]

## 2023-09-25 NOTE — Telephone Encounter (Signed)
Spoke to patient. Letter mailed.

## 2023-10-09 ENCOUNTER — Telehealth: Payer: Self-pay | Admitting: Family Medicine

## 2023-10-09 DIAGNOSIS — M545 Other chronic pain: Secondary | ICD-10-CM

## 2023-10-09 MED ORDER — TIZANIDINE HCL 4 MG PO TABS: 4.0000 mg | ORAL_TABLET | Freq: Four times a day (QID) | ORAL | 0 refills | Status: DC | PRN

## 2023-10-09 NOTE — Telephone Encounter (Signed)
 Pt needs refill of Tizanidine  to [facility] in [place].

## 2023-10-09 NOTE — Telephone Encounter (Signed)
 Last OV 07/07/23 Next OV not scheduled  Last refill 07/07/23 Qty #90/1

## 2023-10-12 MED ORDER — TIZANIDINE HCL 4 MG PO TABS: 4.0000 mg | ORAL_TABLET | Freq: Four times a day (QID) | ORAL | 3 refills | Status: DC | PRN

## 2023-10-12 NOTE — Addendum Note (Signed)
 Addended by: Syliva Even on: 10/12/2023 07:15 AM   Modules accepted: Orders

## 2023-10-12 NOTE — Telephone Encounter (Signed)
Tizanidine refilled

## 2023-11-10 ENCOUNTER — Telehealth: Payer: Self-pay | Admitting: Family Medicine

## 2023-11-10 NOTE — Telephone Encounter (Signed)
 Pt asking if Dr. Joane would prescribe her the Naprosyn  that Dannie takes for her pain. (500mg  twice daily)  [facility] [place]

## 2023-11-10 NOTE — Telephone Encounter (Signed)
 Forwarding to Dr. Joane - new rx request  Renal labs 02/03/20  Renal function panel Order: [id number] Component Ref Range & Units 3 yr ago  Sodium 136 - 144 mmol/L 137  Potassium 3.5 - 5.0 mmol/L 4.8  Chloride 98 - 108 mmol/L 99.9  CO2 22 - 32 mmol/L 24.6  BUN 8 - 20 mg/dl 11  Creatinine 9.55 - 8.96 mg/dL 9.24  Calcium 8.9 - 89.6 mg/dl 89.5 High   Albumin (g/dl) In Ser/Plas 3.5 - 4.8 g/dL 3.4 Low   Phosphorus 2.4 - 4.7 mg/dl 4.8 High   Glucose 65 - 140 mg/dL 75  Comment: Glucose Reference Range:     Random:            65-140 mg/dL     Fasting:           70-99 mg/dL     Fasting Impaired:  100-125 mg/dL     Fasting Sugg. DM:  >125 mg/dL  Normal fasting glucose is less than 100 mg/dL; an impaired fasting glucose is 100-125mg /dL.  A fasting glucose repeatedly greater than 125 is characteristic of diabetes mellitus.  Anion Gap 0.0 - 20.0 mmol/L 12.5  eGFR >=60.0 mL/min/1.80m*2 >60.0  Comment: eGFR Interpretations:   >= 60 ml/min/1.73 m2Normal renal function or slightly decreased GFR   30-59 ml/min/1.73 m2Moderately decreased GFR (stage 3 in chronic renal   disease)   15-29 ml/min/1.73 m2Severely decreased GFR (stage 4 in chronic renal disease)   < 15 ml/min/1.73 m2Kidney failure (stage 5 in chronic renal disease)

## 2023-11-11 MED ORDER — NAPROXEN 500 MG PO TABS
500.0000 mg | ORAL_TABLET | Freq: Two times a day (BID) | ORAL | 3 refills | Status: AC
Start: 2023-11-11 — End: 2024-11-10

## 2023-11-11 NOTE — Telephone Encounter (Signed)
 Called [facility] and advised that refill has been sent and to avoid NSAIDs as listed below per Dr. Joane.

## 2023-11-11 NOTE — Telephone Encounter (Signed)
 Yes but do not take with the diclofenac  or meloxicam  or ibuprofen.

## 2023-11-11 NOTE — Addendum Note (Signed)
 Addended by: JOANE ARTIST RAMAN on: 11/11/2023 07:29 AM   Modules accepted: Orders

## 2024-01-08 ENCOUNTER — Other Ambulatory Visit: Payer: Self-pay | Admitting: Family Medicine

## 2024-01-08 DIAGNOSIS — M545 Low back pain, unspecified: Secondary | ICD-10-CM

## 2024-01-08 MED ORDER — TIZANIDINE HCL 4 MG PO TABS: 4.0000 mg | ORAL_TABLET | Freq: Four times a day (QID) | ORAL | 3 refills | Status: AC | PRN

## 2024-01-08 NOTE — Telephone Encounter (Signed)
 Patient called requesting a refill on: tiZANidine  (ZANAFLEX ) 4 MG tablet   Pharmacy: [facility]

## 2024-01-08 NOTE — Telephone Encounter (Signed)
 Rx refill request approved per Dr. Zollie Pee orders.
